# Patient Record
Sex: Female | Born: 1955 | Race: White | Hispanic: No | Marital: Married
Health system: Southern US, Community
[De-identification: ages and names within clinical notes are randomized; demographics above are authoritative.]

## PROBLEM LIST (undated history)

## (undated) DIAGNOSIS — A419 Sepsis, unspecified organism: Secondary | ICD-10-CM

## (undated) DIAGNOSIS — J942 Hemothorax: Secondary | ICD-10-CM

## (undated) DIAGNOSIS — I469 Cardiac arrest, cause unspecified: Secondary | ICD-10-CM

## (undated) DIAGNOSIS — Z930 Tracheostomy status: Secondary | ICD-10-CM

## (undated) DIAGNOSIS — J9621 Acute and chronic respiratory failure with hypoxia: Secondary | ICD-10-CM

## (undated) DIAGNOSIS — R6520 Severe sepsis without septic shock: Secondary | ICD-10-CM

## (undated) DIAGNOSIS — J181 Lobar pneumonia, unspecified organism: Secondary | ICD-10-CM

---

## 2018-03-01 ENCOUNTER — Inpatient Hospital Stay
Admission: RE | Admit: 2018-03-01 | Discharge: 2018-04-05 | Disposition: A | Discharge status: Home Health | Payer: Medicare Other | Source: Ambulatory Visit | Attending: Internal Medicine | Admitting: Internal Medicine

## 2018-03-01 ENCOUNTER — Other Ambulatory Visit (HOSPITAL_COMMUNITY): Payer: Medicare Other

## 2018-03-01 DIAGNOSIS — J9690 Respiratory failure, unspecified, unspecified whether with hypoxia or hypercapnia: Secondary | ICD-10-CM

## 2018-03-01 DIAGNOSIS — J189 Pneumonia, unspecified organism: Secondary | ICD-10-CM

## 2018-03-01 DIAGNOSIS — J9621 Acute and chronic respiratory failure with hypoxia: Secondary | ICD-10-CM | POA: Diagnosis present

## 2018-03-01 DIAGNOSIS — R112 Nausea with vomiting, unspecified: Secondary | ICD-10-CM

## 2018-03-01 DIAGNOSIS — S92919A Unspecified fracture of unspecified toe(s), initial encounter for closed fracture: Secondary | ICD-10-CM

## 2018-03-01 DIAGNOSIS — I469 Cardiac arrest, cause unspecified: Secondary | ICD-10-CM | POA: Diagnosis present

## 2018-03-01 DIAGNOSIS — J942 Hemothorax: Secondary | ICD-10-CM | POA: Diagnosis present

## 2018-03-01 DIAGNOSIS — A419 Sepsis, unspecified organism: Secondary | ICD-10-CM | POA: Diagnosis present

## 2018-03-01 DIAGNOSIS — J939 Pneumothorax, unspecified: Secondary | ICD-10-CM

## 2018-03-01 DIAGNOSIS — Z431 Encounter for attention to gastrostomy: Secondary | ICD-10-CM

## 2018-03-01 DIAGNOSIS — L0291 Cutaneous abscess, unspecified: Secondary | ICD-10-CM

## 2018-03-01 DIAGNOSIS — Z4659 Encounter for fitting and adjustment of other gastrointestinal appliance and device: Secondary | ICD-10-CM

## 2018-03-01 DIAGNOSIS — R52 Pain, unspecified: Secondary | ICD-10-CM

## 2018-03-01 DIAGNOSIS — R6520 Severe sepsis without septic shock: Secondary | ICD-10-CM

## 2018-03-01 DIAGNOSIS — J181 Lobar pneumonia, unspecified organism: Secondary | ICD-10-CM | POA: Diagnosis present

## 2018-03-01 DIAGNOSIS — Z930 Tracheostomy status: Secondary | ICD-10-CM

## 2018-03-01 HISTORY — DX: Hemothorax: J94.2

## 2018-03-01 HISTORY — DX: Acute and chronic respiratory failure with hypoxia: J96.21

## 2018-03-01 HISTORY — DX: Cardiac arrest, cause unspecified: I46.9

## 2018-03-01 HISTORY — DX: Sepsis, unspecified organism: R65.20

## 2018-03-01 HISTORY — DX: Tracheostomy status: Z93.0

## 2018-03-01 HISTORY — DX: Lobar pneumonia, unspecified organism: J18.1

## 2018-03-01 HISTORY — DX: Sepsis, unspecified organism: A41.9

## 2018-03-01 LAB — BLOOD GAS, ARTERIAL
Acid-Base Excess: 6.8 mmol/L — ABNORMAL HIGH (ref 0.0–2.0)
Bicarbonate: 31.7 mmol/L — ABNORMAL HIGH (ref 20.0–28.0)
FIO2: 28
O2 Saturation: 96.5 %
PATIENT TEMPERATURE: 98.6
PEEP/CPAP: 5 cmH2O
PO2 ART: 82.2 mmHg — AB (ref 83.0–108.0)
Pressure control: 20 cmH2O
RATE: 16 resp/min
pCO2 arterial: 53 mmHg — ABNORMAL HIGH (ref 32.0–48.0)
pH, Arterial: 7.393 (ref 7.350–7.450)

## 2018-03-01 MED ORDER — GENERIC EXTERNAL MEDICATION
700.00 | Status: DC
Start: 2018-03-01 — End: 2018-03-01

## 2018-03-01 MED ORDER — IPRATROPIUM-ALBUTEROL 0.5-2.5 (3) MG/3ML IN SOLN
3.00 | RESPIRATORY_TRACT | Status: DC
Start: 2018-03-01 — End: 2018-03-01

## 2018-03-01 MED ORDER — PHENOL 1.4 % MT LIQD
2.00 | OROMUCOSAL | Status: DC
Start: ? — End: 2018-03-01

## 2018-03-01 MED ORDER — POTASSIUM CHLORIDE 20 MEQ/50ML IV SOLN
20.00 | INTRAVENOUS | Status: DC
Start: ? — End: 2018-03-01

## 2018-03-01 MED ORDER — SODIUM CHLORIDE 0.9 % IV SOLN
10.00 | INTRAVENOUS | Status: DC
Start: ? — End: 2018-03-01

## 2018-03-01 MED ORDER — NYSTATIN 100000 UNIT/ML MT SUSP
500000.00 | OROMUCOSAL | Status: DC
Start: 2018-03-01 — End: 2018-03-01

## 2018-03-01 MED ORDER — MORPHINE SULFATE (PF) 2 MG/ML IV SOLN
2.00 | INTRAVENOUS | Status: DC
Start: ? — End: 2018-03-01

## 2018-03-01 MED ORDER — POTASSIUM CHLORIDE CRYS ER 20 MEQ PO TBCR
20.00 | EXTENDED_RELEASE_TABLET | ORAL | Status: DC
Start: ? — End: 2018-03-01

## 2018-03-01 MED ORDER — PAROXETINE HCL 20 MG PO TABS
20.00 | ORAL_TABLET | ORAL | Status: DC
Start: 2018-03-02 — End: 2018-03-01

## 2018-03-01 MED ORDER — CLONAZEPAM 0.5 MG PO TABS
0.25 | ORAL_TABLET | ORAL | Status: DC
Start: ? — End: 2018-03-01

## 2018-03-01 MED ORDER — CIPROFLOXACIN IN D5W 400 MG/200ML IV SOLN
400.00 | INTRAVENOUS | Status: DC
Start: 2018-03-01 — End: 2018-03-01

## 2018-03-01 MED ORDER — CLOTRIMAZOLE-BETAMETHASONE 1-0.05 % EX CREA
TOPICAL_CREAM | CUTANEOUS | Status: DC
Start: 2018-03-01 — End: 2018-03-01

## 2018-03-01 MED ORDER — FAMOTIDINE 20 MG PO TABS
20.00 | ORAL_TABLET | ORAL | Status: DC
Start: 2018-03-01 — End: 2018-03-01

## 2018-03-01 MED ORDER — MIDODRINE HCL 5 MG PO TABS
10.00 | ORAL_TABLET | ORAL | Status: DC
Start: 2018-03-01 — End: 2018-03-01

## 2018-03-01 MED ORDER — GENERIC EXTERNAL MEDICATION
5.00 | Status: DC
Start: ? — End: 2018-03-01

## 2018-03-01 MED ORDER — POTASSIUM CHLORIDE 20 MEQ/15ML (10%) PO SOLN
20.00 | ORAL | Status: DC
Start: ? — End: 2018-03-01

## 2018-03-02 ENCOUNTER — Encounter: Payer: Self-pay | Admitting: Internal Medicine

## 2018-03-02 DIAGNOSIS — J181 Lobar pneumonia, unspecified organism: Secondary | ICD-10-CM | POA: Diagnosis present

## 2018-03-02 DIAGNOSIS — I469 Cardiac arrest, cause unspecified: Secondary | ICD-10-CM | POA: Diagnosis not present

## 2018-03-02 DIAGNOSIS — J9621 Acute and chronic respiratory failure with hypoxia: Secondary | ICD-10-CM

## 2018-03-02 DIAGNOSIS — Z930 Tracheostomy status: Secondary | ICD-10-CM

## 2018-03-02 DIAGNOSIS — R6520 Severe sepsis without septic shock: Secondary | ICD-10-CM | POA: Diagnosis present

## 2018-03-02 DIAGNOSIS — J942 Hemothorax: Secondary | ICD-10-CM | POA: Diagnosis not present

## 2018-03-02 DIAGNOSIS — A419 Sepsis, unspecified organism: Secondary | ICD-10-CM | POA: Diagnosis present

## 2018-03-02 LAB — PROTIME-INR
INR: 1.05
Prothrombin Time: 13.6 seconds (ref 11.4–15.2)
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
## 2018-03-03 DIAGNOSIS — J942 Hemothorax: Secondary | ICD-10-CM | POA: Diagnosis not present

## 2018-03-03 DIAGNOSIS — J9621 Acute and chronic respiratory failure with hypoxia: Secondary | ICD-10-CM | POA: Diagnosis not present

## 2018-03-03 DIAGNOSIS — J181 Lobar pneumonia, unspecified organism: Secondary | ICD-10-CM | POA: Diagnosis not present

## 2018-03-03 DIAGNOSIS — I469 Cardiac arrest, cause unspecified: Secondary | ICD-10-CM | POA: Diagnosis not present

## 2018-03-03 LAB — CBC
HCT: 33.1 % — ABNORMAL LOW (ref 36.0–46.0)
Hemoglobin: 10.2 g/dL — ABNORMAL LOW (ref 12.0–15.0)
MCH: 30 pg (ref 26.0–34.0)
MCHC: 30.8 g/dL (ref 30.0–36.0)
MCV: 97.4 fL (ref 80.0–100.0)
Platelets: 331 10*3/uL (ref 150–400)
RBC: 3.4 MIL/uL — ABNORMAL LOW (ref 3.87–5.11)
RDW: 18.9 % — AB (ref 11.5–15.5)
WBC: 8.6 10*3/uL (ref 4.0–10.5)
nRBC: 0 % (ref 0.0–0.2)

## 2018-03-03 LAB — BASIC METABOLIC PANEL
Anion gap: 5 (ref 5–15)
BUN: 18 mg/dL (ref 6–20)
CHLORIDE: 104 mmol/L (ref 98–111)
CO2: 35 mmol/L — AB (ref 22–32)
CREATININE: 0.45 mg/dL (ref 0.44–1.00)
Calcium: 7.6 mg/dL — ABNORMAL LOW (ref 8.9–10.3)
Glucose, Bld: 129 mg/dL — ABNORMAL HIGH (ref 70–99)
Potassium: 3.4 mmol/L — ABNORMAL LOW (ref 3.5–5.1)
SODIUM: 144 mmol/L (ref 135–145)

## 2018-03-03 LAB — PROTIME-INR
INR: 1.12
Prothrombin Time: 14.3 seconds (ref 11.4–15.2)

## 2018-03-03 NOTE — Progress Notes (Signed)
Pulmonary Critical Care Medicine [facility] [place]   PULMONARY CRITICAL CARE SERVICE  PROGRESS NOTE  Date of Service: 03/03/2018  Melissa Yates  WUJ:[id number]  DOB: Jun 16, 1955   DOA: 03/01/2018  Referring Physician: Carron Curie, MD  HPI: Melissa Yates is a 62 y.o. female seen for follow up of Acute on Chronic Respiratory Failure.  She is on the ventilator.  Apparently did prep 4 hours of pressure support earlier today according to the respiratory therapist.  Medications: Reviewed on Rounds  Physical Exam:  Vitals: Temperature 97 pulse 88 respiratory rate 24 blood pressure 100/50 saturations 97%  Ventilator Settings mode of ventilation assist control FiO2 28% tidal volume 450 PEEP 5  . General: Comfortable at this time . Eyes: Grossly normal lids, irises & conjunctiva . ENT: grossly tongue is normal . Neck: no obvious mass . Cardiovascular: S1 S2 normal no gallop . Respiratory: Coarse breath sounds without rhonchi . Abdomen: soft . Skin: no rash seen on limited exam . Musculoskeletal: not rigid . Psychiatric:unable to assess . Neurologic: no seizure no involuntary movements         Lab Data:   Basic Metabolic Panel: Recent Labs  Lab 03/03/18 0609  NA 144  K 3.4*  CL 104  CO2 35*  GLUCOSE 129*  BUN 18  CREATININE 0.45  CALCIUM 7.6*    ABG: Recent Labs  Lab 03/01/18 1539  PHART 7.393  PCO2ART 53.0*  PO2ART 82.2*  HCO3 31.7*  O2SAT 96.5    Liver Function Tests: No results for input(s): AST, ALT, ALKPHOS, BILITOT, PROT, ALBUMIN in the last 168 hours. No results for input(s): LIPASE, AMYLASE in the last 168 hours. No results for input(s): AMMONIA in the last 168 hours.  CBC: Recent Labs  Lab 03/03/18 0609  WBC 8.6  HGB 10.2*  HCT 33.1*  MCV 97.4  PLT 331    Cardiac Enzymes: No results for input(s): CKTOTAL, CKMB, CKMBINDEX, TROPONINI in the last 168 hours.  BNP (last 3 results) No results for input(s): BNP in the last  8760 hours.  ProBNP (last 3 results) No results for input(s): PROBNP in the last 8760 hours.  Radiological Exams: Dg Chest Port 1 View  Result Date: 03/01/2018 CLINICAL DATA:  Respiratory failure.  In T2 placement EXAM: PORTABLE CHEST 1 VIEW COMPARISON:  None FINDINGS: Tracheostomy tube in satisfactory position. Tubular structure in the region of the left IJ with the distal portion projecting over the cavoatrial junction. Left-sided Port-A-Cath with the tip projecting over the SVC. Nasogastric tube with the tip projecting over the stomach. No acute osseous abnormality. Small left pleural effusion. Small layering right pleural effusion. Interstitial thickening and alveolar airspace opacities throughout the right lung. Mild interstitial thickening of the left lung. No pneumothorax. Enlarged heart size. IMPRESSION: 1. Support lines and tubing in satisfactory position. 2. Cardiomegaly with bilateral pleural effusions and interstitial and alveolar airspace opacities. Findings are concerning for asymmetric pulmonary edema. Electronically Signed   By: Elige Ko   On: 03/01/2018 16:43   Dg Abd Portable 1v  Result Date: 03/01/2018 CLINICAL DATA:  NG tube placement EXAM: PORTABLE ABDOMEN - 1 VIEW COMPARISON:  None. FINDINGS: NG tube projects in the left upper quadrant, likely in the proximal to mid stomach. Moderate gaseous distention of large bowel. IVC filter noted in place. IMPRESSION: NG tube tip in the proximal to mid stomach. Electronically Signed   By: Charlett Nose M.D.   On: 03/01/2018 16:37    Assessment/Plan Active Problems:  Acute on chronic respiratory failure with hypoxia (HCC)   Lobar pneumonia (HCC)   Tracheostomy status (HCC)   Hemothorax   Severe sepsis (HCC)   Cardiac arrest (HCC)   1. Acute on chronic respiratory failure with hypoxia we will continue to advance weaning patient was able to do 4 hours.  Continue with supportive care 2. Lobar pneumonia treated we will continue  present management 3. Tracheostomy we will continue supportive care 4. Hemothorax follow-up x-rays as necessary there is some evidence of pulmonary edema on the last chest film. 5. Severe sepsis clinically resolved 6. Cardiac arrest rhythm is stable   I have personally seen and evaluated the patient, evaluated laboratory and imaging results, formulated the assessment and plan and placed orders. The Patient requires high complexity decision making for assessment and support.  Case was discussed on Rounds with the Respiratory Therapy Staff  Yevonne Pax, MD [facility] Pulmonary Critical Care Medicine Sleep Medicine

## 2018-03-04 LAB — POTASSIUM: POTASSIUM: 3.7 mmol/L (ref 3.5–5.1)

## 2018-03-04 LAB — PROTIME-INR
INR: 1.15
Prothrombin Time: 14.6 seconds (ref 11.4–15.2)

## 2018-03-05 DIAGNOSIS — J181 Lobar pneumonia, unspecified organism: Secondary | ICD-10-CM | POA: Diagnosis not present

## 2018-03-05 DIAGNOSIS — I469 Cardiac arrest, cause unspecified: Secondary | ICD-10-CM | POA: Diagnosis not present

## 2018-03-05 DIAGNOSIS — J9621 Acute and chronic respiratory failure with hypoxia: Secondary | ICD-10-CM | POA: Diagnosis not present

## 2018-03-05 DIAGNOSIS — J942 Hemothorax: Secondary | ICD-10-CM | POA: Diagnosis not present

## 2018-03-05 LAB — PROTIME-INR
INR: 1.22
Prothrombin Time: 15.2 seconds (ref 11.4–15.2)

## 2018-03-05 NOTE — Progress Notes (Signed)
Pulmonary Critical Care Medicine [facility] [place]   PULMONARY CRITICAL CARE SERVICE  PROGRESS NOTE  Date of Service: 03/05/2018  Melissa Yates  WUJ:[id number]  DOB: [birth year]   DOA: 03/01/2018  Referring Physician: Carron Curie, MD  HPI: Melissa Yates is a 62 y.o. female seen for follow up of Acute on Chronic Respiratory Failure.  Patient is on pressure support with a goal of 6 hours seems to be tolerating it well currently comfortable without distress  Medications: Reviewed on Rounds  Physical Exam:  Vitals: Temperature 97.5 pulse 85 respiratory 19 blood pressure 116/69 saturations 95%  Ventilator Settings mode of ventilation pressure support FiO2 21% per support 12 PEEP 5  . General: Comfortable at this time . Eyes: Grossly normal lids, irises & conjunctiva . ENT: grossly tongue is normal . Neck: no obvious mass . Cardiovascular: S1 S2 normal no gallop . Respiratory: No rhonchi or rales are noted . Abdomen: soft . Skin: no rash seen on limited exam . Musculoskeletal: not rigid . Psychiatric:unable to assess . Neurologic: no seizure no involuntary movements         Lab Data:   Basic Metabolic Panel: Recent Labs  Lab 03/03/18 0609 03/04/18 0612  NA 144  --   K 3.4* 3.7  CL 104  --   CO2 35*  --   GLUCOSE 129*  --   BUN 18  --   CREATININE 0.45  --   CALCIUM 7.6*  --     ABG: Recent Labs  Lab 03/01/18 1539  PHART 7.393  PCO2ART 53.0*  PO2ART 82.2*  HCO3 31.7*  O2SAT 96.5    Liver Function Tests: No results for input(s): AST, ALT, ALKPHOS, BILITOT, PROT, ALBUMIN in the last 168 hours. No results for input(s): LIPASE, AMYLASE in the last 168 hours. No results for input(s): AMMONIA in the last 168 hours.  CBC: Recent Labs  Lab 03/03/18 0609  WBC 8.6  HGB 10.2*  HCT 33.1*  MCV 97.4  PLT 331    Cardiac Enzymes: No results for input(s): CKTOTAL, CKMB, CKMBINDEX, TROPONINI in the last 168 hours.  BNP (last 3 results) No  results for input(s): BNP in the last 8760 hours.  ProBNP (last 3 results) No results for input(s): PROBNP in the last 8760 hours.  Radiological Exams: No results found.  Assessment/Plan Active Problems:   Acute on chronic respiratory failure with hypoxia (HCC)   Lobar pneumonia (HCC)   Tracheostomy status (HCC)   Hemothorax   Severe sepsis (HCC)   Cardiac arrest (HCC)   1. Acute on chronic respiratory failure with hypoxia patient right now is going to continue with pressure support will titrate oxygen as tolerated continue his pulmonary toilet goal is for 6 hours 2. Lobar pneumonia treated to improve 3. Tracheostomy continue present management 4. Hemothorax stable 5. Severe sepsis hemodynamically stable 6. Cardiac arrest rhythm is stable   I have personally seen and evaluated the patient, evaluated laboratory and imaging results, formulated the assessment and plan and placed orders. The Patient requires high complexity decision making for assessment and support.  Case was discussed on Rounds with the Respiratory Therapy Staff  Yevonne Pax, MD [facility] Pulmonary Critical Care Medicine Sleep Medicine

## 2018-03-06 DIAGNOSIS — J9621 Acute and chronic respiratory failure with hypoxia: Secondary | ICD-10-CM | POA: Diagnosis not present

## 2018-03-06 DIAGNOSIS — I469 Cardiac arrest, cause unspecified: Secondary | ICD-10-CM | POA: Diagnosis not present

## 2018-03-06 DIAGNOSIS — J942 Hemothorax: Secondary | ICD-10-CM | POA: Diagnosis not present

## 2018-03-06 DIAGNOSIS — J181 Lobar pneumonia, unspecified organism: Secondary | ICD-10-CM | POA: Diagnosis not present

## 2018-03-06 LAB — PROTIME-INR
INR: 1.62
Prothrombin Time: 19 seconds — ABNORMAL HIGH (ref 11.4–15.2)

## 2018-03-06 NOTE — Progress Notes (Signed)
Pulmonary Critical Care Medicine [facility] [place]   PULMONARY CRITICAL CARE SERVICE  PROGRESS NOTE  Date of Service: 03/06/2018  TORI DATTILIO  LKG:[id number]  DOB: 1955/08/25   DOA: 03/01/2018  Referring Physician: Carron Curie, MD  HPI: RHIA BLATCHFORD is a 62 y.o. female seen for follow up of Acute on Chronic Respiratory Failure.  Patient is currently on pressure support mode has been on 40% oxygen.  Goal was for about 12 hours  Medications: Reviewed on Rounds  Physical Exam:  Vitals: Temperature 97.5 pulse 84 respiratory rate 20 blood pressure 104/58 saturations 98%  Ventilator Settings currently on pressure support FiO2 40% per support 12 PEEP 5  . General: Comfortable at this time . Eyes: Grossly normal lids, irises & conjunctiva . ENT: grossly tongue is normal . Neck: no obvious mass . Cardiovascular: S1 S2 normal no gallop . Respiratory: No rhonchi or rales are noted at this time . Abdomen: soft . Skin: no rash seen on limited exam . Musculoskeletal: not rigid . Psychiatric:unable to assess . Neurologic: no seizure no involuntary movements         Lab Data:   Basic Metabolic Panel: Recent Labs  Lab 03/03/18 0609 03/04/18 0612  NA 144  --   K 3.4* 3.7  CL 104  --   CO2 35*  --   GLUCOSE 129*  --   BUN 18  --   CREATININE 0.45  --   CALCIUM 7.6*  --     ABG: Recent Labs  Lab 03/01/18 1539  PHART 7.393  PCO2ART 53.0*  PO2ART 82.2*  HCO3 31.7*  O2SAT 96.5    Liver Function Tests: No results for input(s): AST, ALT, ALKPHOS, BILITOT, PROT, ALBUMIN in the last 168 hours. No results for input(s): LIPASE, AMYLASE in the last 168 hours. No results for input(s): AMMONIA in the last 168 hours.  CBC: Recent Labs  Lab 03/03/18 0609  WBC 8.6  HGB 10.2*  HCT 33.1*  MCV 97.4  PLT 331    Cardiac Enzymes: No results for input(s): CKTOTAL, CKMB, CKMBINDEX, TROPONINI in the last 168 hours.  BNP (last 3 results) No results for  input(s): BNP in the last 8760 hours.  ProBNP (last 3 results) No results for input(s): PROBNP in the last 8760 hours.  Radiological Exams: No results found.  Assessment/Plan Active Problems:   Acute on chronic respiratory failure with hypoxia (HCC)   Lobar pneumonia (HCC)   Tracheostomy status (HCC)   Hemothorax   Severe sepsis (HCC)   Cardiac arrest (HCC)   1. Acute on chronic respiratory failure with hypoxia we will continue with pressure support on 40% FiO2.  Continue pulmonary toilet secretion management. 2. Lobar pneumonia treated we will continue to follow. 3. Tracheostomy stable 4. Hemothorax stable we will continue to monitor 5. Severe sepsis resolved 6. Cardiac arrest rhythm is stable we will continue to monitor   I have personally seen and evaluated the patient, evaluated laboratory and imaging results, formulated the assessment and plan and placed orders. The Patient requires high complexity decision making for assessment and support.  Case was discussed on Rounds with the Respiratory Therapy Staff  Yevonne Pax, MD [facility] Pulmonary Critical Care Medicine Sleep Medicine

## 2018-03-07 ENCOUNTER — Other Ambulatory Visit (HOSPITAL_COMMUNITY): Payer: Medicare Other

## 2018-03-07 DIAGNOSIS — I469 Cardiac arrest, cause unspecified: Secondary | ICD-10-CM | POA: Diagnosis not present

## 2018-03-07 DIAGNOSIS — J9621 Acute and chronic respiratory failure with hypoxia: Secondary | ICD-10-CM | POA: Diagnosis not present

## 2018-03-07 DIAGNOSIS — J181 Lobar pneumonia, unspecified organism: Secondary | ICD-10-CM | POA: Diagnosis not present

## 2018-03-07 DIAGNOSIS — J942 Hemothorax: Secondary | ICD-10-CM | POA: Diagnosis not present

## 2018-03-07 LAB — PROTIME-INR
INR: 1.83
Prothrombin Time: 20.9 seconds — ABNORMAL HIGH (ref 11.4–15.2)

## 2018-03-07 LAB — CBC
HCT: 39.4 % (ref 36.0–46.0)
HEMOGLOBIN: 11.5 g/dL — AB (ref 12.0–15.0)
MCH: 28.8 pg (ref 26.0–34.0)
MCHC: 29.2 g/dL — ABNORMAL LOW (ref 30.0–36.0)
MCV: 98.5 fL (ref 80.0–100.0)
PLATELETS: 299 10*3/uL (ref 150–400)
RBC: 4 MIL/uL (ref 3.87–5.11)
RDW: 19.1 % — ABNORMAL HIGH (ref 11.5–15.5)
WBC: 8.4 10*3/uL (ref 4.0–10.5)
nRBC: 0 % (ref 0.0–0.2)
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
## 2018-03-08 ENCOUNTER — Other Ambulatory Visit (HOSPITAL_COMMUNITY): Payer: Medicare Other

## 2018-03-08 DIAGNOSIS — J9621 Acute and chronic respiratory failure with hypoxia: Secondary | ICD-10-CM | POA: Diagnosis not present

## 2018-03-08 DIAGNOSIS — J942 Hemothorax: Secondary | ICD-10-CM | POA: Diagnosis not present

## 2018-03-08 DIAGNOSIS — I469 Cardiac arrest, cause unspecified: Secondary | ICD-10-CM | POA: Diagnosis not present

## 2018-03-08 DIAGNOSIS — J181 Lobar pneumonia, unspecified organism: Secondary | ICD-10-CM | POA: Diagnosis not present

## 2018-03-08 LAB — PROTIME-INR
INR: 1.93
PROTHROMBIN TIME: 21.8 s — AB (ref 11.4–15.2)
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
## 2018-03-09 DIAGNOSIS — J9621 Acute and chronic respiratory failure with hypoxia: Secondary | ICD-10-CM | POA: Diagnosis not present

## 2018-03-09 DIAGNOSIS — J181 Lobar pneumonia, unspecified organism: Secondary | ICD-10-CM | POA: Diagnosis not present

## 2018-03-09 DIAGNOSIS — I469 Cardiac arrest, cause unspecified: Secondary | ICD-10-CM | POA: Diagnosis not present

## 2018-03-09 DIAGNOSIS — J942 Hemothorax: Secondary | ICD-10-CM | POA: Diagnosis not present

## 2018-03-09 LAB — PROTIME-INR
INR: 2.24
Prothrombin Time: 24.5 seconds — ABNORMAL HIGH (ref 11.4–15.2)

## 2018-03-09 NOTE — Progress Notes (Signed)
Pulmonary Critical Care Medicine [facility] [place]   PULMONARY CRITICAL CARE SERVICE  PROGRESS NOTE  Date of Service: 03/09/2018  Melissa Yates  WJX:[id number]  DOB: [birth year]   DOA: 03/01/2018  Referring Physician: Carron Curie, MD  HPI: Melissa Yates is a 62 y.o. female seen for follow up of Acute on Chronic Respiratory Failure.  Patient is currently on T collar has been on 28% FiO2 with goal is for 8 hours  Medications: Reviewed on Rounds  Physical Exam:  Vitals: Temperature 96.7 pulse 72 respiratory 15 blood pressure 158/63  Ventilator Settings off the ventilator right now on T collar  . General: Comfortable at this time . Eyes: Grossly normal lids, irises & conjunctiva . ENT: grossly tongue is normal . Neck: no obvious mass . Cardiovascular: S1 S2 normal no gallop . Respiratory: No rhonchi or rales are noted . Abdomen: soft . Skin: no rash seen on limited exam . Musculoskeletal: not rigid . Psychiatric:unable to assess . Neurologic: no seizure no involuntary movements         Lab Data:   Basic Metabolic Panel: Recent Labs  Lab 03/03/18 0609 03/04/18 0612  NA 144  --   K 3.4* 3.7  CL 104  --   CO2 35*  --   GLUCOSE 129*  --   BUN 18  --   CREATININE 0.45  --   CALCIUM 7.6*  --     ABG: No results for input(s): PHART, PCO2ART, PO2ART, HCO3, O2SAT in the last 168 hours.  Liver Function Tests: No results for input(s): AST, ALT, ALKPHOS, BILITOT, PROT, ALBUMIN in the last 168 hours. No results for input(s): LIPASE, AMYLASE in the last 168 hours. No results for input(s): AMMONIA in the last 168 hours.  CBC: Recent Labs  Lab 03/03/18 0609 03/07/18 0026  WBC 8.6 8.4  HGB 10.2* 11.5*  HCT 33.1* 39.4  MCV 97.4 98.5  PLT 331 299    Cardiac Enzymes: No results for input(s): CKTOTAL, CKMB, CKMBINDEX, TROPONINI in the last 168 hours.  BNP (last 3 results) No results for input(s): BNP in the last 8760 hours.  ProBNP (last 3  results) No results for input(s): PROBNP in the last 8760 hours.  Radiological Exams: Dg Abd Portable 1v  Result Date: 03/08/2018 CLINICAL DATA:  Generalized abdominal pain. EXAM: PORTABLE ABDOMEN - 1 VIEW COMPARISON:  Radiograph of March 01, 2018. FINDINGS: Stable position of nasogastric tube with tip in expected position of proximal stomach. Stable colonic distention is noted. No small bowel dilatation is noted. IMPRESSION: Distal tip of nasogastric tube is seen in expected position of proximal stomach. Electronically Signed   By: Lupita Raider, M.D.   On: 03/08/2018 10:10    Assessment/Plan Active Problems:   Acute on chronic respiratory failure with hypoxia (HCC)   Lobar pneumonia (HCC)   Tracheostomy status (HCC)   Hemothorax   Severe sepsis (HCC)   Cardiac arrest (HCC)   1. Acute on chronic respiratory failure with hypoxia we will continue weaning on T collar 8-hour goal tolerating well continue to advance.  Continue secretion management pulmonary toilet.   2. Lobar pneumonia treated we will continue to monitor. 3. Hemothorax at baseline follow-up x-ray to assess for sepsis clinically resolved we will monitor. 4. Severe sepsis clinically resolved we will continue to monitor 5. Cardiac arrest rhythm is stable we will monitor   I have personally seen and evaluated the patient, evaluated laboratory and imaging results, formulated the assessment and plan  and placed orders. The Patient requires high complexity decision making for assessment and support.  Case was discussed on Rounds with the Respiratory Therapy Staff  Allyne Gee, MD [facility] Pulmonary Critical Care Medicine Sleep Medicine

## 2018-03-10 DIAGNOSIS — J181 Lobar pneumonia, unspecified organism: Secondary | ICD-10-CM | POA: Diagnosis not present

## 2018-03-10 DIAGNOSIS — J9621 Acute and chronic respiratory failure with hypoxia: Secondary | ICD-10-CM | POA: Diagnosis not present

## 2018-03-10 DIAGNOSIS — I469 Cardiac arrest, cause unspecified: Secondary | ICD-10-CM | POA: Diagnosis not present

## 2018-03-10 DIAGNOSIS — J942 Hemothorax: Secondary | ICD-10-CM | POA: Diagnosis not present

## 2018-03-10 LAB — PROTIME-INR
INR: 2.56
Prothrombin Time: 27.1 seconds — ABNORMAL HIGH (ref 11.4–15.2)

## 2018-03-10 NOTE — Progress Notes (Signed)
Pulmonary Critical Care Medicine [facility] [place]   PULMONARY CRITICAL CARE SERVICE  PROGRESS NOTE  Date of Service: 03/10/2018  Melissa Yates  ZOX:[id number]  DOB: 07/13/1955   DOA: 03/01/2018  Referring Physician: Carron Curie, MD  HPI: Melissa Yates is a 62 y.o. female seen for follow up of Acute on Chronic Respiratory Failure.  Patient right now is on T collar on 40% oxygen good saturations are noted has been on T collar for more than 12 hours  Medications: Reviewed on Rounds  Physical Exam:  Vitals: Temperature 96.9 pulse 72 respiratory rate 12 blood pressure 102/50 saturations 98%  Ventilator Settings off the ventilator on T collar FiO2 40%  . General: Comfortable at this time . Eyes: Grossly normal lids, irises & conjunctiva . ENT: grossly tongue is normal . Neck: no obvious mass . Cardiovascular: S1 S2 normal no gallop . Respiratory: No rhonchi or rales are noted . Abdomen: soft . Skin: no rash seen on limited exam . Musculoskeletal: not rigid . Psychiatric:unable to assess . Neurologic: no seizure no involuntary movements         Lab Data:   Basic Metabolic Panel: Recent Labs  Lab 03/04/18 0612  K 3.7    ABG: No results for input(s): PHART, PCO2ART, PO2ART, HCO3, O2SAT in the last 168 hours.  Liver Function Tests: No results for input(s): AST, ALT, ALKPHOS, BILITOT, PROT, ALBUMIN in the last 168 hours. No results for input(s): LIPASE, AMYLASE in the last 168 hours. No results for input(s): AMMONIA in the last 168 hours.  CBC: Recent Labs  Lab 03/07/18 0026  WBC 8.4  HGB 11.5*  HCT 39.4  MCV 98.5  PLT 299    Cardiac Enzymes: No results for input(s): CKTOTAL, CKMB, CKMBINDEX, TROPONINI in the last 168 hours.  BNP (last 3 results) No results for input(s): BNP in the last 8760 hours.  ProBNP (last 3 results) No results for input(s): PROBNP in the last 8760 hours.  Radiological Exams: No results  found.  Assessment/Plan Active Problems:   Acute on chronic respiratory failure with hypoxia (HCC)   Lobar pneumonia (HCC)   Tracheostomy status (HCC)   Hemothorax   Severe sepsis (HCC)   Cardiac arrest (HCC)   1. Acute on chronic respiratory failure with hypoxia we will continue weaning on T piece 12-hour goal today 2. Lobar pneumonia treated clinically improving 3. Pneumothorax stable we will monitor 4. Severe sepsis hemodynamically stable 5. Cardiac arrest rhythm is stable 6. Tracheostomy working hopefully towards decannulation   I have personally seen and evaluated the patient, evaluated laboratory and imaging results, formulated the assessment and plan and placed orders. The Patient requires high complexity decision making for assessment and support.  Case was discussed on Rounds with the Respiratory Therapy Staff  Yevonne Pax, MD [facility] Pulmonary Critical Care Medicine Sleep Medicine

## 2018-03-11 DIAGNOSIS — J9621 Acute and chronic respiratory failure with hypoxia: Secondary | ICD-10-CM | POA: Diagnosis not present

## 2018-03-11 DIAGNOSIS — I469 Cardiac arrest, cause unspecified: Secondary | ICD-10-CM | POA: Diagnosis not present

## 2018-03-11 DIAGNOSIS — J181 Lobar pneumonia, unspecified organism: Secondary | ICD-10-CM | POA: Diagnosis not present

## 2018-03-11 DIAGNOSIS — J942 Hemothorax: Secondary | ICD-10-CM | POA: Diagnosis not present

## 2018-03-11 LAB — CBC WITH DIFFERENTIAL/PLATELET
ABS IMMATURE GRANULOCYTES: 0.03 10*3/uL (ref 0.00–0.07)
BASOS PCT: 0 %
Basophils Absolute: 0 10*3/uL (ref 0.0–0.1)
Eosinophils Absolute: 0.3 10*3/uL (ref 0.0–0.5)
Eosinophils Relative: 3 %
HCT: 37.6 % (ref 36.0–46.0)
Hemoglobin: 11.1 g/dL — ABNORMAL LOW (ref 12.0–15.0)
IMMATURE GRANULOCYTES: 0 %
LYMPHS PCT: 18 %
Lymphs Abs: 1.7 10*3/uL (ref 0.7–4.0)
MCH: 29.1 pg (ref 26.0–34.0)
MCHC: 29.5 g/dL — ABNORMAL LOW (ref 30.0–36.0)
MCV: 98.7 fL (ref 80.0–100.0)
MONOS PCT: 9 %
Monocytes Absolute: 0.9 10*3/uL (ref 0.1–1.0)
NEUTROS ABS: 6.6 10*3/uL (ref 1.7–7.7)
Neutrophils Relative %: 70 %
PLATELETS: 234 10*3/uL (ref 150–400)
RBC: 3.81 MIL/uL — AB (ref 3.87–5.11)
RDW: 18.8 % — ABNORMAL HIGH (ref 11.5–15.5)
WBC: 9.6 10*3/uL (ref 4.0–10.5)
nRBC: 0 % (ref 0.0–0.2)

## 2018-03-11 LAB — BASIC METABOLIC PANEL
Anion gap: 7 (ref 5–15)
BUN: 26 mg/dL — AB (ref 6–20)
CHLORIDE: 100 mmol/L (ref 98–111)
CO2: 40 mmol/L — ABNORMAL HIGH (ref 22–32)
Calcium: 8.3 mg/dL — ABNORMAL LOW (ref 8.9–10.3)
Creatinine, Ser: 0.57 mg/dL (ref 0.44–1.00)
GFR calc Af Amer: >60 mL/min (ref >60)
GFR calc non Af Amer: >60 mL/min (ref >60)
GLUCOSE: 123 mg/dL — AB (ref 70–99)
POTASSIUM: 3.5 mmol/L (ref 3.5–5.1)
SODIUM: 147 mmol/L — AB (ref 135–145)

## 2018-03-11 LAB — PROTIME-INR
INR: 2.8
Prothrombin Time: 29.1 seconds — ABNORMAL HIGH (ref 11.4–15.2)

## 2018-03-11 NOTE — Progress Notes (Signed)
Pulmonary Critical Care Medicine [facility] [place]   PULMONARY CRITICAL CARE SERVICE  PROGRESS NOTE  Date of Service: 03/11/2018  Melissa Yates  [id number]  DOB: [birth year]   DOA: 03/01/2018  Referring Physician: Carron Curie, MD  HPI: Melissa Yates is a 62 y.o. female seen for follow up of Acute on Chronic Respiratory Failure.  Patient currently is on assist control mode is on 28% oxygen not able to do any weaning today.  Yesterday patient had been on T collar 8 hours  Medications: Reviewed on Rounds  Physical Exam:  Vitals: Temperature 96.9 pulse 80 respiratory 21 blood pressure 117/69 saturations 96%  Ventilator Settings mode ventilation assist control FiO2 28% tidal volume 400 PEEP 5  . General: Comfortable at this time . Eyes: Grossly normal lids, irises & conjunctiva . ENT: grossly tongue is normal . Neck: no obvious mass . Cardiovascular: S1 S2 normal no gallop . Respiratory: Coarse rhonchi expansion is equal . Abdomen: soft . Skin: no rash seen on limited exam . Musculoskeletal: not rigid . Psychiatric:unable to assess . Neurologic: no seizure no involuntary movements         Lab Data:   Basic Metabolic Panel: Recent Labs  Lab 03/11/18 0958  NA 147*  K 3.5  CL 100  CO2 40*  GLUCOSE 123*  BUN 26*  CREATININE 0.57  CALCIUM 8.3*    ABG: No results for input(s): PHART, PCO2ART, PO2ART, HCO3, O2SAT in the last 168 hours.  Liver Function Tests: No results for input(s): AST, ALT, ALKPHOS, BILITOT, PROT, ALBUMIN in the last 168 hours. No results for input(s): LIPASE, AMYLASE in the last 168 hours. No results for input(s): AMMONIA in the last 168 hours.  CBC: Recent Labs  Lab 03/07/18 0026 03/11/18 0958  WBC 8.4 9.6  NEUTROABS  --  6.6  HGB 11.5* 11.1*  HCT 39.4 37.6  MCV 98.5 98.7  PLT 299 234    Cardiac Enzymes: No results for input(s): CKTOTAL, CKMB, CKMBINDEX, TROPONINI in the last 168 hours.  BNP (last 3  results) No results for input(s): BNP in the last 8760 hours.  ProBNP (last 3 results) No results for input(s): PROBNP in the last 8760 hours.  Radiological Exams: No results found.  Assessment/Plan Active Problems:   Acute on chronic respiratory failure with hypoxia (HCC)   Lobar pneumonia (HCC)   Tracheostomy status (HCC)   Hemothorax   Severe sepsis (HCC)   Cardiac arrest (HCC)   1. Acute on chronic respiratory failure with hypoxia we will continue with full support respiratory therapy will recheck in the R SBI to see if the patient is able to wean.  We will continue pulmonary toilet 2. Lobar pneumonia treated will continue to follow-up x-rays. 3. Hemothorax improved we will continue monitoring 4. Severe sepsis hemodynamically stable resolved 5. Cardiac arrest rhythm is stable   I have personally seen and evaluated the patient, evaluated laboratory and imaging results, formulated the assessment and plan and placed orders. The Patient requires high complexity decision making for assessment and support.  Case was discussed on Rounds with the Respiratory Therapy Staff  Yevonne Pax, MD [facility] Pulmonary Critical Care Medicine Sleep Medicine

## 2018-03-12 ENCOUNTER — Other Ambulatory Visit (HOSPITAL_COMMUNITY): Payer: Medicare Other

## 2018-03-12 DIAGNOSIS — J9621 Acute and chronic respiratory failure with hypoxia: Secondary | ICD-10-CM | POA: Diagnosis not present

## 2018-03-12 DIAGNOSIS — I469 Cardiac arrest, cause unspecified: Secondary | ICD-10-CM | POA: Diagnosis not present

## 2018-03-12 DIAGNOSIS — J942 Hemothorax: Secondary | ICD-10-CM | POA: Diagnosis not present

## 2018-03-12 DIAGNOSIS — J181 Lobar pneumonia, unspecified organism: Secondary | ICD-10-CM | POA: Diagnosis not present

## 2018-03-12 LAB — PROTIME-INR
INR: 2.55
Prothrombin Time: 27.1 seconds — ABNORMAL HIGH (ref 11.4–15.2)

## 2018-03-12 MED ORDER — IOHEXOL 300 MG/ML  SOLN
100.0000 mL | Freq: Once | INTRAMUSCULAR | Status: AC | PRN
  Administered 2018-03-12: 100 mL via INTRAVENOUS

## 2018-03-12 NOTE — Progress Notes (Signed)
Pulmonary Critical Care Medicine [facility] [place]   PULMONARY CRITICAL CARE SERVICE  PROGRESS NOTE  Date of Service: 03/12/2018  Melissa Yates  [id number]  DOB: [birth year]   DOA: 03/01/2018  Referring Physician: Carron Curie, MD  HPI: Melissa Yates is a 62 y.o. female seen for follow up of Acute on Chronic Respiratory Failure.  Currently is on pressure support mode patient has been on 28% oxygen with good saturations.  PEEP is 5 CT scan showed fluid collection which may be hindering her from being able to wean  Medications: Reviewed on Rounds  Physical Exam:  Vitals: Temperature 98.3 pulse 59 respiratory rate 16 blood pressure 107/59 saturations 98%  Ventilator Settings mode of ventilation pressure support FiO2 28% tidal volume 470 PEEP 5  . General: Comfortable at this time . Eyes: Grossly normal lids, irises & conjunctiva . ENT: grossly tongue is normal . Neck: no obvious mass . Cardiovascular: S1 S2 normal no gallop . Respiratory: Few rhonchi no rales are noted . Abdomen: soft . Skin: no rash seen on limited exam . Musculoskeletal: not rigid . Psychiatric:unable to assess . Neurologic: no seizure no involuntary movements         Lab Data:   Basic Metabolic Panel: Recent Labs  Lab 03/11/18 0958  NA 147*  K 3.5  CL 100  CO2 40*  GLUCOSE 123*  BUN 26*  CREATININE 0.57  CALCIUM 8.3*    ABG: No results for input(s): PHART, PCO2ART, PO2ART, HCO3, O2SAT in the last 168 hours.  Liver Function Tests: No results for input(s): AST, ALT, ALKPHOS, BILITOT, PROT, ALBUMIN in the last 168 hours. No results for input(s): LIPASE, AMYLASE in the last 168 hours. No results for input(s): AMMONIA in the last 168 hours.  CBC: Recent Labs  Lab 03/07/18 0026 03/11/18 0958  WBC 8.4 9.6  NEUTROABS  --  6.6  HGB 11.5* 11.1*  HCT 39.4 37.6  MCV 98.5 98.7  PLT 299 234    Cardiac Enzymes: No results for input(s): CKTOTAL, CKMB, CKMBINDEX,  TROPONINI in the last 168 hours.  BNP (last 3 results) No results for input(s): BNP in the last 8760 hours.  ProBNP (last 3 results) No results for input(s): PROBNP in the last 8760 hours.  Radiological Exams: Ct Abdomen Pelvis W Contrast  Result Date: 03/12/2018 CLINICAL DATA:  Acute onset of generalized abdominal pain. Severe sepsis. EXAM: CT ABDOMEN AND PELVIS WITH CONTRAST TECHNIQUE: Multidetector CT imaging of the abdomen and pelvis was performed using the standard protocol following bolus administration of intravenous contrast. CONTRAST:  OMNIPAQUE IOHEXOL 300 MG/ML  SOLN COMPARISON:  Abdominal radiograph performed 03/08/2018 FINDINGS: Lower chest: Mild bibasilar atelectasis is noted. The visualized portions of the mediastinum are unremarkable. The patient's enteric tube is noted ending at the fundus of the stomach. Hepatobiliary: The liver is unremarkable in appearance. The gallbladder is unremarkable in appearance. The common bile duct remains normal in caliber. Pancreas: The pancreas is within normal limits. Spleen: The spleen is unremarkable in appearance. Adrenals/Urinary Tract: The adrenal glands are unremarkable in appearance. The kidneys are within normal limits. There is no evidence of hydronephrosis. No renal or ureteral stones are identified, though evaluation is suboptimal on the left due to apparent contrast in the renal calyces. No perinephric stranding is seen. Stomach/Bowel: The stomach is unremarkable in appearance. The small bowel is within normal limits. The appendix is not visualized; there is no evidence for appendicitis. The colon is unremarkable in appearance. Vascular/Lymphatic: The abdominal  aorta is unremarkable in appearance. An IVC filter is noted in expected position, though the prongs appear to extend outside of the wall of the IVC. No retroperitoneal lymphadenopathy is seen. No pelvic sidewall lymphadenopathy is identified. Reproductive: There is vague  prominence of the soft tissues about the level of the vaginal vault, without definite mass. The uterus is grossly unremarkable. The ovaries are relatively symmetric. The bladder is decompressed, with a suprapubic catheter in place. Other: A large collection of fluid at the right breast, measuring up to 20.4 x 5.6 cm, may reflect prior surgery. Would correlate clinically. There is diffuse atrophy of much of the musculature. Diffuse soft tissue edema is noted along the proximal thighs bilaterally, with chronic degenerative change at both acetabula and a chronic displaced fracture of the proximal left femur. Musculoskeletal: No acute osseous abnormalities are identified. The visualized musculature is unremarkable in appearance. IMPRESSION: 1. No acute abnormality seen to explain the patient's symptoms. 2. Large collection of fluid at the right breast, measuring up to 20.4 x 5.6 cm, may reflect prior surgery. Would correlate clinically, to help exclude abscess. 3. Vague prominence of the soft tissues about the level of the vaginal vault, without definite mass. Would correlate for any associated symptoms. 4. Diffuse atrophy of much of the musculature. Diffuse soft tissue edema along the proximal thighs bilaterally, with chronic degenerative change at both acetabula and chronic displaced fracture of the proximal left femur. 5. Mild bibasilar atelectasis. Electronically Signed   By: Roanna Raider M.D.   On: 03/12/2018 01:43    Assessment/Plan Active Problems:   Acute on chronic respiratory failure with hypoxia (HCC)   Lobar pneumonia (HCC)   Tracheostomy status (HCC)   Hemothorax   Severe sepsis (HCC)   Cardiac arrest (HCC)   1. Acute on chronic respiratory failure with hypoxia patient was able to do about 2 hours weaning will try to continue to advance.  Continue pulmonary toilet secretion management. 2. Lobar pneumonia treated we will continue with supportive care 3. hemothorax stable at this time follow  radiologically.  She did have fluid collection around the right breast possibly a reflection of prior surgery 4. Severe sepsis hemodynamically stable doing better. 5. Cardiac arrest rhythm is been stable   I have personally seen and evaluated the patient, evaluated laboratory and imaging results, formulated the assessment and plan and placed orders. The Patient requires high complexity decision making for assessment and support.  Case was discussed on Rounds with the Respiratory Therapy Staff  Yevonne Pax, MD [facility] Pulmonary Critical Care Medicine Sleep Medicine

## 2018-03-13 DIAGNOSIS — I469 Cardiac arrest, cause unspecified: Secondary | ICD-10-CM | POA: Diagnosis not present

## 2018-03-13 DIAGNOSIS — J9621 Acute and chronic respiratory failure with hypoxia: Secondary | ICD-10-CM | POA: Diagnosis not present

## 2018-03-13 DIAGNOSIS — J942 Hemothorax: Secondary | ICD-10-CM | POA: Diagnosis not present

## 2018-03-13 DIAGNOSIS — J181 Lobar pneumonia, unspecified organism: Secondary | ICD-10-CM | POA: Diagnosis not present

## 2018-03-13 LAB — BASIC METABOLIC PANEL
Anion gap: 7 (ref 5–15)
BUN: 22 mg/dL — AB (ref 6–20)
CALCIUM: 8.2 mg/dL — AB (ref 8.9–10.3)
CO2: 38 mmol/L — ABNORMAL HIGH (ref 22–32)
CREATININE: 0.66 mg/dL (ref 0.44–1.00)
Chloride: 104 mmol/L (ref 98–111)
GFR calc non Af Amer: >60 mL/min (ref >60)
Glucose, Bld: 119 mg/dL — ABNORMAL HIGH (ref 70–99)
Potassium: 3.4 mmol/L — ABNORMAL LOW (ref 3.5–5.1)
SODIUM: 149 mmol/L — AB (ref 135–145)

## 2018-03-13 LAB — PROTIME-INR
INR: 2.45
Prothrombin Time: 26.2 seconds — ABNORMAL HIGH (ref 11.4–15.2)

## 2018-03-13 NOTE — Progress Notes (Signed)
Pulmonary Critical Care Medicine [facility] [place]   PULMONARY CRITICAL CARE SERVICE  PROGRESS NOTE  Date of Service: 03/13/2018  Melissa Yates  ZOX:[id number]  DOB: June 29, 1955   DOA: 03/01/2018  Referring Physician: Carron Curie, MD  HPI: Melissa Yates is a 62 y.o. female seen for follow up of Acute on Chronic Respiratory Failure.  She is back on her weaning currently is on T collar has been on 35% FiO2 the goal is to wean her for up to 16 hours today  Medications: Reviewed on Rounds  Physical Exam:  Vitals: Temperature 96.5 pulse 97 respiratory rate 15 blood pressure 103/58 saturation 98%  Ventilator Settings off the ventilator on T collar FiO2 35%  . General: Comfortable at this time . Eyes: Grossly normal lids, irises & conjunctiva . ENT: grossly tongue is normal . Neck: no obvious mass . Cardiovascular: S1 S2 normal no gallop . Respiratory: No rhonchi or rales are noted at this time . Abdomen: soft . Skin: no rash seen on limited exam . Musculoskeletal: not rigid . Psychiatric:unable to assess . Neurologic: no seizure no involuntary movements         Lab Data:   Basic Metabolic Panel: Recent Labs  Lab 03/11/18 0958 03/13/18 0807  NA 147* 149*  K 3.5 3.4*  CL 100 104  CO2 40* 38*  GLUCOSE 123* 119*  BUN 26* 22*  CREATININE 0.57 0.66  CALCIUM 8.3* 8.2*    ABG: No results for input(s): PHART, PCO2ART, PO2ART, HCO3, O2SAT in the last 168 hours.  Liver Function Tests: No results for input(s): AST, ALT, ALKPHOS, BILITOT, PROT, ALBUMIN in the last 168 hours. No results for input(s): LIPASE, AMYLASE in the last 168 hours. No results for input(s): AMMONIA in the last 168 hours.  CBC: Recent Labs  Lab 03/07/18 0026 03/11/18 0958  WBC 8.4 9.6  NEUTROABS  --  6.6  HGB 11.5* 11.1*  HCT 39.4 37.6  MCV 98.5 98.7  PLT 299 234    Cardiac Enzymes: No results for input(s): CKTOTAL, CKMB, CKMBINDEX, TROPONINI in the last 168 hours.  BNP  (last 3 results) No results for input(s): BNP in the last 8760 hours.  ProBNP (last 3 results) No results for input(s): PROBNP in the last 8760 hours.  Radiological Exams: Ct Abdomen Pelvis W Contrast  Result Date: 03/12/2018 CLINICAL DATA:  Acute onset of generalized abdominal pain. Severe sepsis. EXAM: CT ABDOMEN AND PELVIS WITH CONTRAST TECHNIQUE: Multidetector CT imaging of the abdomen and pelvis was performed using the standard protocol following bolus administration of intravenous contrast. CONTRAST:  OMNIPAQUE IOHEXOL 300 MG/ML  SOLN COMPARISON:  Abdominal radiograph performed 03/08/2018 FINDINGS: Lower chest: Mild bibasilar atelectasis is noted. The visualized portions of the mediastinum are unremarkable. The patient's enteric tube is noted ending at the fundus of the stomach. Hepatobiliary: The liver is unremarkable in appearance. The gallbladder is unremarkable in appearance. The common bile duct remains normal in caliber. Pancreas: The pancreas is within normal limits. Spleen: The spleen is unremarkable in appearance. Adrenals/Urinary Tract: The adrenal glands are unremarkable in appearance. The kidneys are within normal limits. There is no evidence of hydronephrosis. No renal or ureteral stones are identified, though evaluation is suboptimal on the left due to apparent contrast in the renal calyces. No perinephric stranding is seen. Stomach/Bowel: The stomach is unremarkable in appearance. The small bowel is within normal limits. The appendix is not visualized; there is no evidence for appendicitis. The colon is unremarkable in appearance.  Vascular/Lymphatic: The abdominal aorta is unremarkable in appearance. An IVC filter is noted in expected position, though the prongs appear to extend outside of the wall of the IVC. No retroperitoneal lymphadenopathy is seen. No pelvic sidewall lymphadenopathy is identified. Reproductive: There is vague prominence of the soft tissues about the level of  the vaginal vault, without definite mass. The uterus is grossly unremarkable. The ovaries are relatively symmetric. The bladder is decompressed, with a suprapubic catheter in place. Other: A large collection of fluid at the right breast, measuring up to 20.4 x 5.6 cm, may reflect prior surgery. Would correlate clinically. There is diffuse atrophy of much of the musculature. Diffuse soft tissue edema is noted along the proximal thighs bilaterally, with chronic degenerative change at both acetabula and a chronic displaced fracture of the proximal left femur. Musculoskeletal: No acute osseous abnormalities are identified. The visualized musculature is unremarkable in appearance. IMPRESSION: 1. No acute abnormality seen to explain the patient's symptoms. 2. Large collection of fluid at the right breast, measuring up to 20.4 x 5.6 cm, may reflect prior surgery. Would correlate clinically, to help exclude abscess. 3. Vague prominence of the soft tissues about the level of the vaginal vault, without definite mass. Would correlate for any associated symptoms. 4. Diffuse atrophy of much of the musculature. Diffuse soft tissue edema along the proximal thighs bilaterally, with chronic degenerative change at both acetabula and chronic displaced fracture of the proximal left femur. 5. Mild bibasilar atelectasis. Electronically Signed   By: Roanna Raider M.D.   On: 03/12/2018 01:43    Assessment/Plan Active Problems:   Acute on chronic respiratory failure with hypoxia (HCC)   Lobar pneumonia (HCC)   Tracheostomy status (HCC)   Hemothorax   Severe sepsis (HCC)   Cardiac arrest (HCC)   1. Acute on chronic respiratory failure with hypoxia continue weaning on T collar titrate oxygen as tolerated continue pulmonary toilet. 2. Lobar pneumonia treated clinically improving 3. Tracheostomy will remain in place 4. Hemothorax continue with supportive care no hemothorax however he did have some fluid collection in the right  breast 5. Severe sepsis resolved 6. Cardiac arrest rhythm is been stable   I have personally seen and evaluated the patient, evaluated laboratory and imaging results, formulated the assessment and plan and placed orders. The Patient requires high complexity decision making for assessment and support.  Case was discussed on Rounds with the Respiratory Therapy Staff  Yevonne Pax, MD [facility] Pulmonary Critical Care Medicine Sleep Medicine

## 2018-03-13 NOTE — Progress Notes (Signed)
IR received request for placement of gastrostomy tube. Recent CT reviewed. Stomach is predominantly covered by the liver but may have an amenable approach for percutaneous placement. Chart reviewed, on chronic Coumadin. Last INR was 2.45 Coumadin will need to be held several days to allow INR to normalize. Will tentatively plan for procedure later this week.  Brayton El PA-C Interventional Radiology 03/13/2018 3:12 PM

## 2018-03-14 DIAGNOSIS — J942 Hemothorax: Secondary | ICD-10-CM | POA: Diagnosis not present

## 2018-03-14 DIAGNOSIS — I469 Cardiac arrest, cause unspecified: Secondary | ICD-10-CM | POA: Diagnosis not present

## 2018-03-14 DIAGNOSIS — J181 Lobar pneumonia, unspecified organism: Secondary | ICD-10-CM | POA: Diagnosis not present

## 2018-03-14 DIAGNOSIS — J9621 Acute and chronic respiratory failure with hypoxia: Secondary | ICD-10-CM | POA: Diagnosis not present

## 2018-03-14 LAB — PROTIME-INR
INR: 2.67
Prothrombin Time: 28.1 seconds — ABNORMAL HIGH (ref 11.4–15.2)
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
## 2018-03-14 NOTE — Progress Notes (Signed)
Pulmonary Critical Care Medicine [facility] [place]   PULMONARY CRITICAL CARE SERVICE  PROGRESS NOTE  Date of Service: 03/14/2018  Melissa MansDonna H Rash  [id number]  DOB: [birth year]   DOA: 03/01/2018  Referring Physician: Carron CurieAli Hijazi, MD  HPI: Melissa Yates is a 62 y.o. female seen for follow up of Acute on Chronic Respiratory Failure.  Patient is on T collar currently on 28% FiO2 has been off the ventilator for more than 24 hours now  Medications: Reviewed on Rounds  Physical Exam:  Vitals: Temperature 96.8 pulse 62 respiratory rate 16 blood pressure 91/57 saturations 99%  Ventilator Settings off the ventilator on T collar trials  . General: Comfortable at this time . Eyes: Grossly normal lids, irises & conjunctiva . ENT: grossly tongue is normal . Neck: no obvious mass . Cardiovascular: S1 S2 normal no gallop . Respiratory: No rhonchi or rales . Abdomen: soft . Skin: no rash seen on limited exam . Musculoskeletal: not rigid . Psychiatric:unable to assess . Neurologic: no seizure no involuntary movements         Lab Data:   Basic Metabolic Panel: Recent Labs  Lab 03/11/18 0958 03/13/18 0807  NA 147* 149*  K 3.5 3.4*  CL 100 104  CO2 40* 38*  GLUCOSE 123* 119*  BUN 26* 22*  CREATININE 0.57 0.66  CALCIUM 8.3* 8.2*    ABG: No results for input(s): PHART, PCO2ART, PO2ART, HCO3, O2SAT in the last 168 hours.  Liver Function Tests: No results for input(s): AST, ALT, ALKPHOS, BILITOT, PROT, ALBUMIN in the last 168 hours. No results for input(s): LIPASE, AMYLASE in the last 168 hours. No results for input(s): AMMONIA in the last 168 hours.  CBC: Recent Labs  Lab 03/11/18 0958  WBC 9.6  NEUTROABS 6.6  HGB 11.1*  HCT 37.6  MCV 98.7  PLT 234    Cardiac Enzymes: No results for input(s): CKTOTAL, CKMB, CKMBINDEX, TROPONINI in the last 168 hours.  BNP (last 3 results) No results for input(s): BNP in the last 8760 hours.  ProBNP (last 3  results) No results for input(s): PROBNP in the last 8760 hours.  Radiological Exams: No results found.  Assessment/Plan Active Problems:   Acute on chronic respiratory failure with hypoxia (HCC)   Lobar pneumonia (HCC)   Tracheostomy status (HCC)   Hemothorax   Severe sepsis (HCC)   Cardiac arrest (HCC)   1. Acute on chronic respiratory failure with hypoxia continue weaning on T collar hopefully should be able to begin PMV and capping. 2. Lobar pneumonia treated clinically improved 3. Pneumothorax resolved 4. Severe sepsis hemodynamically stable 5. Cardiac arrest rhythm is stable we will continue to monitor   I have personally seen and evaluated the patient, evaluated laboratory and imaging results, formulated the assessment and plan and placed orders. The Patient requires high complexity decision making for assessment and support.  Case was discussed on Rounds with the Respiratory Therapy Staff  Yevonne PaxSaadat A Khan, MD [facility] Pulmonary Critical Care Medicine Sleep Medicine

## 2018-03-15 ENCOUNTER — Other Ambulatory Visit (HOSPITAL_COMMUNITY): Payer: Medicare Other

## 2018-03-15 DIAGNOSIS — J181 Lobar pneumonia, unspecified organism: Secondary | ICD-10-CM | POA: Diagnosis not present

## 2018-03-15 DIAGNOSIS — I469 Cardiac arrest, cause unspecified: Secondary | ICD-10-CM | POA: Diagnosis not present

## 2018-03-15 DIAGNOSIS — J9621 Acute and chronic respiratory failure with hypoxia: Secondary | ICD-10-CM | POA: Diagnosis not present

## 2018-03-15 DIAGNOSIS — J942 Hemothorax: Secondary | ICD-10-CM | POA: Diagnosis not present

## 2018-03-15 LAB — PROTIME-INR
INR: 2.8
PROTHROMBIN TIME: 29.1 s — AB (ref 11.4–15.2)

## 2018-03-15 LAB — BASIC METABOLIC PANEL
Anion gap: 4 — ABNORMAL LOW (ref 5–15)
BUN: 33 mg/dL — AB (ref 6–20)
CALCIUM: 8.3 mg/dL — AB (ref 8.9–10.3)
CO2: 42 mmol/L — AB (ref 22–32)
Chloride: 105 mmol/L (ref 98–111)
Creatinine, Ser: 0.74 mg/dL (ref 0.44–1.00)
GFR calc Af Amer: >60 mL/min (ref >60)
GLUCOSE: 128 mg/dL — AB (ref 70–99)
Potassium: 3.4 mmol/L — ABNORMAL LOW (ref 3.5–5.1)
Sodium: 151 mmol/L — ABNORMAL HIGH (ref 135–145)

## 2018-03-15 LAB — TROPONIN I
Troponin I: <0.03 ng/mL (ref <0.03)
Troponin I: <0.03 ng/mL (ref <0.03)

## 2018-03-15 NOTE — Progress Notes (Signed)
[facility] [place]   PULMONARY CRITICAL CARE SERVICE  PROGRESS NOTE  Date of Service: 03/15/2018  Melissa Yates  [id number]  DOB: May 20, 1955   DOA: 03/01/2018  Referring Physician: Carron CurieAli Hijazi, MD  HPI: Melissa MansDonna H Shearn is a 62 y.o. female seen for follow up of Acute on Chronic Respiratory Failure.  Patient is on T collar right now currently on 28% oxygen has been off the ventilator for more than 24 hours  Medications: Reviewed on Rounds  Physical Exam:  Vitals: Temperature 96.1 pulse 68 respiratory rate 16 blood pressure 115/64 saturations 99%  Ventilator Settings currently off the ventilator on T collar  . General: Comfortable at this time . Eyes: Grossly normal lids, irises & conjunctiva . ENT: grossly tongue is normal . Neck: no obvious mass . Cardiovascular: S1 S2 normal no gallop . Respiratory: No rhonchi no rales . Abdomen: soft . Skin: no rash seen on limited exam . Musculoskeletal: not rigid . Psychiatric:unable to assess . Neurologic: no seizure no involuntary movements         Lab Data:   Basic Metabolic Panel: Recent Labs  Lab 03/11/18 0958 03/13/18 0807 03/15/18 0506  NA 147* 149* 151*  K 3.5 3.4* 3.4*  CL 100 104 105  CO2 40* 38* 42*  GLUCOSE 123* 119* 128*  BUN 26* 22* 33*  CREATININE 0.57 0.66 0.74  CALCIUM 8.3* 8.2* 8.3*    ABG: No results for input(s): PHART, PCO2ART, PO2ART, HCO3, O2SAT in the last 168 hours.  Liver Function Tests: No results for input(s): AST, ALT, ALKPHOS, BILITOT, PROT, ALBUMIN in the last 168 hours. No results for input(s): LIPASE, AMYLASE in the last 168 hours. No results for input(s): AMMONIA in the last 168 hours.  CBC: Recent Labs  Lab 03/11/18 0958  WBC 9.6  NEUTROABS 6.6  HGB 11.1*  HCT 37.6  MCV 98.7  PLT 234    Cardiac Enzymes: Recent Labs  Lab 03/15/18 1136  TROPONINI <0.03    BNP (last 3 results) No results for input(s): BNP in the  last 8760 hours.  ProBNP (last 3 results) No results for input(s): PROBNP in the last 8760 hours.  Radiological Exams: No results found.  Assessment/Plan Active Problems:   Acute on chronic respiratory failure with hypoxia (HCC)   Lobar pneumonia (HCC)   Tracheostomy status (HCC)   Hemothorax   Severe sepsis (HCC)   Cardiac arrest (HCC)   1. Acute on chronic respiratory failure with hypoxia continue with weaning on T collar continue with pulmonary toilet should be able to increase to try PMV again tomorrow 2. Lobar pneumonia treated we will continue to follow 3. Hemothorax at baseline 4. Severe sepsis resolved 5. Cardiac arrest rhythm is stable continue to monitor. 6. Tracheostomy remains in place   I have personally seen and evaluated the patient, evaluated laboratory and imaging results, formulated the assessment and plan and placed orders. The Patient requires high complexity decision making for assessment and support.  Case was discussed on Rounds with the Respiratory Therapy Staff  Yevonne PaxSaadat A Khan, MD [facility] Pulmonary Critical Care Medicine Sleep Medicine

## 2018-03-16 DIAGNOSIS — J181 Lobar pneumonia, unspecified organism: Secondary | ICD-10-CM | POA: Diagnosis not present

## 2018-03-16 DIAGNOSIS — J942 Hemothorax: Secondary | ICD-10-CM | POA: Diagnosis not present

## 2018-03-16 DIAGNOSIS — I469 Cardiac arrest, cause unspecified: Secondary | ICD-10-CM | POA: Diagnosis not present

## 2018-03-16 DIAGNOSIS — J9621 Acute and chronic respiratory failure with hypoxia: Secondary | ICD-10-CM | POA: Diagnosis not present

## 2018-03-16 LAB — TROPONIN I: Troponin I: <0.03 ng/mL (ref <0.03)

## 2018-03-16 LAB — PROTIME-INR
INR: 2.4
PROTHROMBIN TIME: 25.9 s — AB (ref 11.4–15.2)

## 2018-03-16 NOTE — Progress Notes (Signed)
[facility]   PULMONARY CRITICAL CARE SERVICE  PROGRESS NOTE  Date of Service: 03/16/2018  Melissa Yates  JWJ:[id number]  DOB: 03-08-1956   DOA: 03/01/2018  Referring Physician: Carron CurieAli Hijazi, MD  HPI: Melissa Yates is a 62 y.o. female seen for follow up of Acute on Chronic Respiratory Failure.  Patient is on T collar currently on 28% FiO2 has been off the ventilator for more than 48 hours now  Medications: Reviewed on Rounds  Physical Exam:  Vitals: Temperature 97.0 pulse 84 respiratory 18 blood pressure 109/56 saturations 96%  Ventilator Settings off the ventilator on T collar currently on 28% FiO2  . General: Comfortable at this time . Eyes: Grossly normal lids, irises & conjunctiva . ENT: grossly tongue is normal . Neck: no obvious mass . Cardiovascular: S1 S2 normal no gallop . Respiratory: No rhonchi or rales are noted at this time . Abdomen: soft . Skin: no rash seen on limited exam . Musculoskeletal: not rigid . Psychiatric:unable to assess . Neurologic: no seizure no involuntary movements         Lab Data:   Basic Metabolic Panel: Recent Labs  Lab 03/11/18 0958 03/13/18 0807 03/15/18 0506  NA 147* 149* 151*  K 3.5 3.4* 3.4*  CL 100 104 105  CO2 40* 38* 42*  GLUCOSE 123* 119* 128*  BUN 26* 22* 33*  CREATININE 0.57 0.66 0.74  CALCIUM 8.3* 8.2* 8.3*    ABG: No results for input(s): PHART, PCO2ART, PO2ART, HCO3, O2SAT in the last 168 hours.  Liver Function Tests: No results for input(s): AST, ALT, ALKPHOS, BILITOT, PROT, ALBUMIN in the last 168 hours. No results for input(s): LIPASE, AMYLASE in the last 168 hours. No results for input(s): AMMONIA in the last 168 hours.  CBC: Recent Labs  Lab 03/11/18 0958  WBC 9.6  NEUTROABS 6.6  HGB 11.1*  HCT 37.6  MCV 98.7  PLT 234    Cardiac Enzymes: Recent Labs  Lab 03/15/18 1136 03/15/18 1922 03/16/18 0232  TROPONINI <0.03 <0.03 <0.03     BNP (last 3 results) No results for input(s): BNP in the last 8760 hours.  ProBNP (last 3 results) No results for input(s): PROBNP in the last 8760 hours.  Radiological Exams: Dg Foot 2 Views Left  Result Date: 03/15/2018 CLINICAL DATA:  Toe fracture. EXAM: LEFT FOOT - 2 VIEW COMPARISON:  None. FINDINGS: Limited positioning due to patient condition. Marked osteopenia. There is dislocation of the fifth MTP joint. Fracture lucency at the proximal shaft of the second proximal phalanx with dorsal impaction seen on the lateral view. No visible callus, this fracture is likely acute or subacute-but fracture aging is limited by degree of profound osteopenia. Cavus foot. IMPRESSION: 1. Second proximal phalanx fracture at the proximal shaft with dorsal impaction. 2. Dislocated fifth MTP joint. 3. Profound osteopenia. Electronically Signed   By: Marnee SpringJonathon  Watts M.D.   On: 03/15/2018 15:01    Assessment/Plan Active Problems:   Acute on chronic respiratory failure with hypoxia (HCC)   Lobar pneumonia (HCC)   Tracheostomy status (HCC)   Hemothorax   Severe sepsis (HCC)   Cardiac arrest (HCC)   1. Acute on chronic respiratory failure with hypoxia we will continue with T collar trials continue trending toilet secretion management.  Patient is doing well continue to advance with weaning. 2. Lobar pneumonia treated we will continue with present management. 3. Tracheostomy remains in place she is at baseline now 4. Pneumothorax stable  at this time 5. Severe sepsis hemodynamically stable 6. Cardiac arrest rhythm has been stable at this time   I have personally seen and evaluated the patient, evaluated laboratory and imaging results, formulated the assessment and plan and placed orders. The Patient requires high complexity decision making for assessment and support.  Case was discussed on Rounds with the Respiratory Therapy Staff  Yevonne Pax, MD [facility] Pulmonary Critical Care Medicine Sleep  Medicine

## 2018-03-17 DIAGNOSIS — J9621 Acute and chronic respiratory failure with hypoxia: Secondary | ICD-10-CM | POA: Diagnosis not present

## 2018-03-17 DIAGNOSIS — J942 Hemothorax: Secondary | ICD-10-CM | POA: Diagnosis not present

## 2018-03-17 DIAGNOSIS — J181 Lobar pneumonia, unspecified organism: Secondary | ICD-10-CM | POA: Diagnosis not present

## 2018-03-17 DIAGNOSIS — I469 Cardiac arrest, cause unspecified: Secondary | ICD-10-CM | POA: Diagnosis not present

## 2018-03-17 LAB — PROTIME-INR
INR: 1.94
Prothrombin Time: 21.9 seconds — ABNORMAL HIGH (ref 11.4–15.2)

## 2018-03-17 NOTE — Progress Notes (Signed)
Pulmonary Critical Care Medicine [facility] [place]   PULMONARY CRITICAL CARE SERVICE  PROGRESS NOTE  Date of Service: 03/17/2018  ELIZ NIGG  WJX:[id number]  DOB: 1956-03-09   DOA: 03/01/2018  Referring Physician: Carron Curie, MD  HPI: BAUDELIA SCHROEPFER is a 62 y.o. female seen for follow up of Acute on Chronic Respiratory Failure.  Patient right now is on T collar has been off the ventilator for more than 48 hours  Medications: Reviewed on Rounds  Physical Exam:  Vitals: Temperature 97.2 pulse 62 respiratory rate 17 blood pressure 119/55 saturations 97%  Ventilator Settings mode of ventilation is spontaneous on T collar FiO2 28%  . General: Comfortable at this time . Eyes: Grossly normal lids, irises & conjunctiva . ENT: grossly tongue is normal . Neck: no obvious mass . Cardiovascular: S1 S2 normal no gallop . Respiratory: Coarse breath sounds no rhonchi are noted at this time . Abdomen: soft . Skin: no rash seen on limited exam . Musculoskeletal: not rigid . Psychiatric:unable to assess . Neurologic: no seizure no involuntary movements         Lab Data:   Basic Metabolic Panel: Recent Labs  Lab 03/11/18 0958 03/13/18 0807 03/15/18 0506  NA 147* 149* 151*  K 3.5 3.4* 3.4*  CL 100 104 105  CO2 40* 38* 42*  GLUCOSE 123* 119* 128*  BUN 26* 22* 33*  CREATININE 0.57 0.66 0.74  CALCIUM 8.3* 8.2* 8.3*    ABG: No results for input(s): PHART, PCO2ART, PO2ART, HCO3, O2SAT in the last 168 hours.  Liver Function Tests: No results for input(s): AST, ALT, ALKPHOS, BILITOT, PROT, ALBUMIN in the last 168 hours. No results for input(s): LIPASE, AMYLASE in the last 168 hours. No results for input(s): AMMONIA in the last 168 hours.  CBC: Recent Labs  Lab 03/11/18 0958  WBC 9.6  NEUTROABS 6.6  HGB 11.1*  HCT 37.6  MCV 98.7  PLT 234    Cardiac Enzymes: Recent Labs  Lab 03/15/18 1136 03/15/18 1922 03/16/18 0232  TROPONINI <0.03 <0.03  <0.03    BNP (last 3 results) No results for input(s): BNP in the last 8760 hours.  ProBNP (last 3 results) No results for input(s): PROBNP in the last 8760 hours.  Radiological Exams: Dg Foot 2 Views Left  Result Date: 03/15/2018 CLINICAL DATA:  Toe fracture. EXAM: LEFT FOOT - 2 VIEW COMPARISON:  None. FINDINGS: Limited positioning due to patient condition. Marked osteopenia. There is dislocation of the fifth MTP joint. Fracture lucency at the proximal shaft of the second proximal phalanx with dorsal impaction seen on the lateral view. No visible callus, this fracture is likely acute or subacute-but fracture aging is limited by degree of profound osteopenia. Cavus foot. IMPRESSION: 1. Second proximal phalanx fracture at the proximal shaft with dorsal impaction. 2. Dislocated fifth MTP joint. 3. Profound osteopenia. Electronically Signed   By: Marnee Spring M.D.   On: 03/15/2018 15:01    Assessment/Plan Active Problems:   Acute on chronic respiratory failure with hypoxia (HCC)   Lobar pneumonia (HCC)   Tracheostomy status (HCC)   Hemothorax   Severe sepsis (HCC)   Cardiac arrest (HCC)   1. Acute on chronic respiratory failure with hypoxia patient is going to be getting a PEG tube placed today remains off the ventilator we will need to change her trach out and family will be required to be retrained for tracheal care 2. Lobar pneumonia treated clinically improved 3. Tracheostomy will need to be  changed 4. Hemothorax resolved 5. Severe sepsis stable hemodynamically 6. Cardiac arrest rhythm is been stable   I have personally seen and evaluated the patient, evaluated laboratory and imaging results, formulated the assessment and plan and placed orders. The Patient requires high complexity decision making for assessment and support.  Case was discussed on Rounds with the Respiratory Therapy Staff  Yevonne PaxSaadat A Earlin Sweeden, MD [facility] Pulmonary Critical Care Medicine Sleep Medicine

## 2018-03-18 DIAGNOSIS — J181 Lobar pneumonia, unspecified organism: Secondary | ICD-10-CM | POA: Diagnosis not present

## 2018-03-18 DIAGNOSIS — J942 Hemothorax: Secondary | ICD-10-CM | POA: Diagnosis not present

## 2018-03-18 DIAGNOSIS — J9621 Acute and chronic respiratory failure with hypoxia: Secondary | ICD-10-CM | POA: Diagnosis not present

## 2018-03-18 DIAGNOSIS — I469 Cardiac arrest, cause unspecified: Secondary | ICD-10-CM | POA: Diagnosis not present

## 2018-03-18 NOTE — Progress Notes (Signed)
[facility] [place]   PULMONARY CRITICAL CARE SERVICE  PROGRESS NOTE  Date of Service: 03/18/2018  Melissa MansDonna H Dittus  WUJ:[id number]  DOB: July 08, 1955   DOA: 03/01/2018  Referring Physician: Carron CurieAli Hijazi, MD  HPI: Melissa Yates is a 62 y.o. female seen for follow up of Acute on Chronic Respiratory Failure.  Patient is on T collar currently on 28% oxygen has been off the ventilator for more than 72 hours  Medications: Reviewed on Rounds  Physical Exam:  Vitals: Temperature 98.6 pulse 82 respiratory rate 16 blood pressure 100/56 saturations 96%  Ventilator Settings off the ventilator on T collar right now  . General: Comfortable at this time . Eyes: Grossly normal lids, irises & conjunctiva . ENT: grossly tongue is normal . Neck: no obvious mass . Cardiovascular: S1 S2 normal no gallop . Respiratory: No rhonchi no rales are noted at this time . Abdomen: soft . Skin: no rash seen on limited exam . Musculoskeletal: not rigid . Psychiatric:unable to assess . Neurologic: no seizure no involuntary movements         Lab Data:   Basic Metabolic Panel: Recent Labs  Lab 03/13/18 0807 03/15/18 0506  NA 149* 151*  K 3.4* 3.4*  CL 104 105  CO2 38* 42*  GLUCOSE 119* 128*  BUN 22* 33*  CREATININE 0.66 0.74  CALCIUM 8.2* 8.3*    ABG: No results for input(s): PHART, PCO2ART, PO2ART, HCO3, O2SAT in the last 168 hours.  Liver Function Tests: No results for input(s): AST, ALT, ALKPHOS, BILITOT, PROT, ALBUMIN in the last 168 hours. No results for input(s): LIPASE, AMYLASE in the last 168 hours. No results for input(s): AMMONIA in the last 168 hours.  CBC: No results for input(s): WBC, NEUTROABS, HGB, HCT, MCV, PLT in the last 168 hours.  Cardiac Enzymes: Recent Labs  Lab 03/15/18 1136 03/15/18 1922 03/16/18 0232  TROPONINI <0.03 <0.03 <0.03    BNP (last 3 results) No results for input(s): BNP in the last 8760  hours.  ProBNP (last 3 results) No results for input(s): PROBNP in the last 8760 hours.  Radiological Exams: No results found.  Assessment/Plan Active Problems:   Acute on chronic respiratory failure with hypoxia (HCC)   Lobar pneumonia (HCC)   Tracheostomy status (HCC)   Hemothorax   Severe sepsis (HCC)   Cardiac arrest (HCC)   1. Acute on chronic respiratory failure with hypoxia patient will be continued on T collar trials continue pulmonary toilet supportive care. 2. Lobar pneumonia treated we will continue to monitor. 3. Tracheostomy remains in place she will go home with a Shiley trach instead of a Jackson trach 4. Hemothorax at baseline we will continue present management 5. Severe sepsis hemodynamically stable 6. Cardiac arrest rhythm stable   I have personally seen and evaluated the patient, evaluated laboratory and imaging results, formulated the assessment and plan and placed orders. The Patient requires high complexity decision making for assessment and support.  Case was discussed on Rounds with the Respiratory Therapy Staff  Yevonne PaxSaadat A Aaima Gaddie, MD [facility] Pulmonary Critical Care Medicine Sleep Medicine

## 2018-03-19 DIAGNOSIS — J942 Hemothorax: Secondary | ICD-10-CM | POA: Diagnosis not present

## 2018-03-19 DIAGNOSIS — I469 Cardiac arrest, cause unspecified: Secondary | ICD-10-CM | POA: Diagnosis not present

## 2018-03-19 DIAGNOSIS — J181 Lobar pneumonia, unspecified organism: Secondary | ICD-10-CM | POA: Diagnosis not present

## 2018-03-19 DIAGNOSIS — J9621 Acute and chronic respiratory failure with hypoxia: Secondary | ICD-10-CM | POA: Diagnosis not present

## 2018-03-19 LAB — PROTIME-INR
INR: 1.39
Prothrombin Time: 16.9 seconds — ABNORMAL HIGH (ref 11.4–15.2)

## 2018-03-19 NOTE — Progress Notes (Signed)
[facility] [place]   PULMONARY CRITICAL CARE SERVICE  PROGRESS NOTE  Date of Service: 03/19/2018  Melissa MansDonna H Dulude  ZHY:[id number]  DOB: April 18, 1956   DOA: 03/01/2018  Referring Physician: Carron CurieAli Hijazi, MD  HPI: Melissa Yates is a 62 y.o. female seen for follow up of Acute on Chronic Respiratory Failure.  Patient is currently on T collar weaning doing fine on 28% FiO2  Medications: Reviewed on Rounds  Physical Exam:  Vitals: Temperature 96.7 pulse 84 respiratory 15 blood pressure 99/54 saturations 96%  Ventilator Settings off the ventilator on T collar  . General: Comfortable at this time . Eyes: Grossly normal lids, irises & conjunctiva . ENT: grossly tongue is normal . Neck: no obvious mass . Cardiovascular: S1 S2 normal no gallop . Respiratory: No rhonchi no rales . Abdomen: soft . Skin: no rash seen on limited exam . Musculoskeletal: not rigid . Psychiatric:unable to assess . Neurologic: no seizure no involuntary movements         Lab Data:   Basic Metabolic Panel: Recent Labs  Lab 03/13/18 0807 03/15/18 0506  NA 149* 151*  K 3.4* 3.4*  CL 104 105  CO2 38* 42*  GLUCOSE 119* 128*  BUN 22* 33*  CREATININE 0.66 0.74  CALCIUM 8.2* 8.3*    ABG: No results for input(s): PHART, PCO2ART, PO2ART, HCO3, O2SAT in the last 168 hours.  Liver Function Tests: No results for input(s): AST, ALT, ALKPHOS, BILITOT, PROT, ALBUMIN in the last 168 hours. No results for input(s): LIPASE, AMYLASE in the last 168 hours. No results for input(s): AMMONIA in the last 168 hours.  CBC: No results for input(s): WBC, NEUTROABS, HGB, HCT, MCV, PLT in the last 168 hours.  Cardiac Enzymes: Recent Labs  Lab 03/15/18 1136 03/15/18 1922 03/16/18 0232  TROPONINI <0.03 <0.03 <0.03    BNP (last 3 results) No results for input(s): BNP in the last 8760 hours.  ProBNP (last 3 results) No results for input(s): PROBNP in the last 8760  hours.  Radiological Exams: No results found.  Assessment/Plan Active Problems:   Acute on chronic respiratory failure with hypoxia (HCC)   Lobar pneumonia (HCC)   Tracheostomy status (HCC)   Hemothorax   Severe sepsis (HCC)   Cardiac arrest (HCC)   1. Acute on chronic respiratory failure with hypoxia we will continue with T collar wean as tolerated continue secretion management pulmonary toilet. 2. Lobar pneumonia treated clinically improved 3. Hemothorax resolved 4. Severe sepsis resolved 5. Cardiac arrest rhythm is stable   I have personally seen and evaluated the patient, evaluated laboratory and imaging results, formulated the assessment and plan and placed orders. The Patient requires high complexity decision making for assessment and support.  Case was discussed on Rounds with the Respiratory Therapy Staff  Yevonne PaxSaadat A Rylen Swindler, MD [facility] Pulmonary Critical Care Medicine Sleep Medicine

## 2018-03-20 ENCOUNTER — Other Ambulatory Visit (HOSPITAL_COMMUNITY): Payer: Medicare Other

## 2018-03-20 ENCOUNTER — Encounter (HOSPITAL_COMMUNITY): Payer: Self-pay | Admitting: Interventional Radiology

## 2018-03-20 DIAGNOSIS — I469 Cardiac arrest, cause unspecified: Secondary | ICD-10-CM | POA: Diagnosis not present

## 2018-03-20 DIAGNOSIS — J9621 Acute and chronic respiratory failure with hypoxia: Secondary | ICD-10-CM | POA: Diagnosis not present

## 2018-03-20 DIAGNOSIS — J181 Lobar pneumonia, unspecified organism: Secondary | ICD-10-CM | POA: Diagnosis not present

## 2018-03-20 DIAGNOSIS — J942 Hemothorax: Secondary | ICD-10-CM | POA: Diagnosis not present

## 2018-03-20 HISTORY — PX: IR GASTROSTOMY TUBE MOD SED: IMG625

## 2018-03-20 MED ORDER — GLUCAGON HCL RDNA (DIAGNOSTIC) 1 MG IJ SOLR
INTRAMUSCULAR | Status: AC
  Filled 2018-03-20: qty 1

## 2018-03-20 MED ORDER — LIDOCAINE HCL 1 % IJ SOLN
INTRAMUSCULAR | Status: AC
  Filled 2018-03-20: qty 20

## 2018-03-20 MED ORDER — FENTANYL CITRATE (PF) 100 MCG/2ML IJ SOLN
INTRAMUSCULAR | Status: AC | PRN
  Administered 2018-03-20: 12.5 ug via INTRAVENOUS

## 2018-03-20 MED ORDER — LIDOCAINE HCL 1 % IJ SOLN
INTRAMUSCULAR | Status: AC | PRN
  Administered 2018-03-20: 5 mL

## 2018-03-20 MED ORDER — MIDAZOLAM HCL 2 MG/2ML IJ SOLN
INTRAMUSCULAR | Status: AC | PRN
  Administered 2018-03-20: 0.5 mg via INTRAVENOUS

## 2018-03-20 MED ORDER — IOPAMIDOL (ISOVUE-300) INJECTION 61%
INTRAVENOUS | Status: AC
  Administered 2018-03-20: 15 mL
  Filled 2018-03-20: qty 50

## 2018-03-20 MED ORDER — FENTANYL CITRATE (PF) 100 MCG/2ML IJ SOLN
INTRAMUSCULAR | Status: AC
  Filled 2018-03-20: qty 2

## 2018-03-20 MED ORDER — CEFAZOLIN SODIUM-DEXTROSE 2-4 GM/100ML-% IV SOLN
INTRAVENOUS | Status: AC
  Administered 2018-03-20: 2000 mg
  Filled 2018-03-20: qty 100

## 2018-03-20 MED ORDER — MIDAZOLAM HCL 2 MG/2ML IJ SOLN
INTRAMUSCULAR | Status: AC
  Filled 2018-03-20: qty 2

## 2018-03-20 MED ORDER — GLUCAGON HCL RDNA (DIAGNOSTIC) 1 MG IJ SOLR
INTRAMUSCULAR | Status: AC | PRN
  Administered 2018-03-20: .5 mg via INTRAVENOUS

## 2018-03-20 NOTE — Progress Notes (Signed)
[facility]ulmonary [facility] [place]   PULMONARY CRITICAL CARE SERVICE  PROGRESS NOTE  Date of Service: 03/20/2018  Melissa Yates  LOV:[id number]  DOB: 1955/12/17   DOA: 03/01/2018  Referring Physician: Carron CurieAli Hijazi, MD  HPI: Melissa MansDonna H Yates is a 62 y.o. female seen for follow up of Acute on Chronic Respiratory Failure.  On T collar rate now 28% FiO2  Medications: Reviewed on Rounds  Physical Exam:  Vitals: Temperature 97.2 pulse 80 respiratory rate 16 blood pressure 96/59 saturation 95%  Ventilator Settings currently off the ventilator on T collar  . General: Comfortable at this time . Eyes: Grossly normal lids, irises & conjunctiva . ENT: grossly tongue is normal . Neck: no obvious mass . Cardiovascular: S1 S2 normal no gallop . Respiratory: No rhonchi no rales . Abdomen: soft . Skin: no rash seen on limited exam . Musculoskeletal: not rigid . Psychiatric:unable to assess . Neurologic: no seizure no involuntary movements         Lab Data:   Basic Metabolic Panel: Recent Labs  Lab 03/15/18 0506  NA 151*  K 3.4*  CL 105  CO2 42*  GLUCOSE 128*  BUN 33*  CREATININE 0.74  CALCIUM 8.3*    ABG: No results for input(s): PHART, PCO2ART, PO2ART, HCO3, O2SAT in the last 168 hours.  Liver Function Tests: No results for input(s): AST, ALT, ALKPHOS, BILITOT, PROT, ALBUMIN in the last 168 hours. No results for input(s): LIPASE, AMYLASE in the last 168 hours. No results for input(s): AMMONIA in the last 168 hours.  CBC: No results for input(s): WBC, NEUTROABS, HGB, HCT, MCV, PLT in the last 168 hours.  Cardiac Enzymes: Recent Labs  Lab 03/15/18 1136 03/15/18 1922 03/16/18 0232  TROPONINI <0.03 <0.03 <0.03    BNP (last 3 results) No results for input(s): BNP in the last 8760 hours.  ProBNP (last 3 results) No results for input(s): PROBNP in the last 8760 hours.  Radiological Exams: No results  found.  Assessment/Plan Active Problems:   Acute on chronic respiratory failure with hypoxia (HCC)   Lobar pneumonia (HCC)   Tracheostomy status (HCC)   Hemothorax   Severe sepsis (HCC)   Cardiac arrest (HCC)   1. Acute on chronic respiratory failure hypoxia continue with the T collar trial continue pulmonary toilet secretion management. 2. Lobar pneumonia treated we will follow 3. Hemothorax stable 4. Severe sepsis resolved 5. Cardiac arrest rhythm stable   I have personally seen and evaluated the patient, evaluated laboratory and imaging results, formulated the assessment and plan and placed orders. The Patient requires high complexity decision making for assessment and support.  Case was discussed on Rounds with the Respiratory Therapy Staff  Yevonne PaxSaadat A Kyliyah Stirn, MD [facility] Pulmonary Critical Care Medicine Sleep Medicine

## 2018-03-20 NOTE — Procedures (Signed)
Dysphagia  S/p fluoro 7020fr Gtube  No comp Stable ebl min Full use tomorrow Report in pacs

## 2018-03-21 DIAGNOSIS — J942 Hemothorax: Secondary | ICD-10-CM | POA: Diagnosis not present

## 2018-03-21 DIAGNOSIS — J9621 Acute and chronic respiratory failure with hypoxia: Secondary | ICD-10-CM | POA: Diagnosis not present

## 2018-03-21 DIAGNOSIS — J181 Lobar pneumonia, unspecified organism: Secondary | ICD-10-CM | POA: Diagnosis not present

## 2018-03-21 DIAGNOSIS — I469 Cardiac arrest, cause unspecified: Secondary | ICD-10-CM | POA: Diagnosis not present

## 2018-03-21 NOTE — Progress Notes (Signed)
Pulmonary Critical Care Medicine [facility] [place]   PULMONARY CRITICAL CARE SERVICE  PROGRESS NOTE  Date of Service: 03/21/2018  Melissa Yates  [id number]  DOB: Feb 23, 1956   DOA: 03/01/2018  Referring Physician: Carron Curie, MD  HPI: Melissa Yates is a 62 y.o. female seen for follow up of Acute on Chronic Respiratory Failure.  Patient is on T collar she is at her baseline now she has had the PEG placed no issues with that she has the NG tube out  Medications: Reviewed on Rounds  Physical Exam:  Vitals: Temperature 98.2 pulse 74 respiratory rate 24 blood pressure 96/50 saturations 97%  Ventilator Settings currently on T collar FiO2 28%  . General: Comfortable at this time . Eyes: Grossly normal lids, irises & conjunctiva . ENT: grossly tongue is normal . Neck: no obvious mass . Cardiovascular: S1 S2 normal no gallop . Respiratory: No rhonchi or rales are noted . Abdomen: soft . Skin: no rash seen on limited exam . Musculoskeletal: not rigid . Psychiatric:unable to assess . Neurologic: no seizure no involuntary movements         Lab Data:   Basic Metabolic Panel: Recent Labs  Lab 03/15/18 0506  NA 151*  K 3.4*  CL 105  CO2 42*  GLUCOSE 128*  BUN 33*  CREATININE 0.74  CALCIUM 8.3*    ABG: No results for input(s): PHART, PCO2ART, PO2ART, HCO3, O2SAT in the last 168 hours.  Liver Function Tests: No results for input(s): AST, ALT, ALKPHOS, BILITOT, PROT, ALBUMIN in the last 168 hours. No results for input(s): LIPASE, AMYLASE in the last 168 hours. No results for input(s): AMMONIA in the last 168 hours.  CBC: No results for input(s): WBC, NEUTROABS, HGB, HCT, MCV, PLT in the last 168 hours.  Cardiac Enzymes: Recent Labs  Lab 03/15/18 1136 03/15/18 1922 03/16/18 0232  TROPONINI <0.03 <0.03 <0.03    BNP (last 3 results) No results for input(s): BNP in the last 8760 hours.  ProBNP (last 3 results) No results for input(s):  PROBNP in the last 8760 hours.  Radiological Exams: Ir Gastrostomy Tube Mod Sed  Result Date: 03/20/2018 INDICATION: Dysphagia EXAM: FLUOROSCOPIC 20 FRENCH PULL-THROUGH GASTROSTOMY Date:  03/20/2018 03/20/2018 3:34 pm Radiologist:  Judie Petit. Ruel Favors, MD Guidance:  Fluoroscopic MEDICATIONS: Ancef 2 g; Antibiotics were administered within 1 hour of the procedure. Glucagon 0.5 mg IV ANESTHESIA/SEDATION: Versed 0.5 mg mg IV; Fentanyl 12.5 mcg IV Moderate Sedation Time:  10 minutes The patient was continuously monitored during the procedure by the interventional radiology nurse under my direct supervision. CONTRAST:  15 cc Isovue-300-administered into the gastric lumen. FLUOROSCOPY TIME:  Fluoroscopy Time: 2 minutes 54 seconds (9 mGy). COMPLICATIONS: None immediate. PROCEDURE: Informed consent was obtained from the patient following explanation of the procedure, risks, benefits and alternatives. The patient understands, agrees and consents for the procedure. All questions were addressed. A time out was performed. Maximal barrier sterile technique utilized including caps, mask, sterile gowns, sterile gloves, large sterile drape, hand hygiene, and betadine prep. The left upper quadrant was sterilely prepped and draped. An oral gastric catheter was inserted into the stomach under fluoroscopy. The existing nasogastric feeding tube was removed. Air was injected into the stomach for insufflation and visualization under fluoroscopy. The air distended stomach was confirmed beneath the anterior abdominal wall in the frontal and lateral projections. Under sterile conditions and local anesthesia, a 17 gauge trocar needle was utilized to access the stomach percutaneously beneath the left subcostal margin.  Needle position was confirmed within the stomach under biplane fluoroscopy. Contrast injection confirmed position also. A single T tack was deployed for gastropexy. Over an Amplatz guide wire, a 9-French sheath was inserted  into the stomach. A snare device was utilized to capture the oral gastric catheter. The snare device was pulled retrograde from the stomach up the esophagus and out the oropharynx. The 20-French pull-through gastrostomy was connected to the snare device and pulled antegrade through the oropharynx down the esophagus into the stomach and then through the percutaneous tract external to the patient. The gastrostomy was assembled externally. Contrast injection confirms position in the stomach. Images were obtained for documentation. The patient tolerated procedure well. No immediate complication. IMPRESSION: Fluoroscopic insertion of a 20-French "pull-through" gastrostomy. Electronically Signed   By: Judie PetitM.  Shick M.D.   On: 03/20/2018 15:38    Assessment/Plan Active Problems:   Acute on chronic respiratory failure with hypoxia (HCC)   Lobar pneumonia (HCC)   Tracheostomy status (HCC)   Hemothorax   Severe sepsis (HCC)   Cardiac arrest (HCC)   1. Acute on chronic respiratory failure with hypoxia continue with T collar trials continue pulmonary toilet supportive care. 2. Lobar pneumonia treated we will continue to monitor. 3. Pneumothorax at baseline 4. Severe sepsis resolved 5. Cardiac arrest rhythm is stable 6. Tracheostomy remains in place   I have personally seen and evaluated the patient, evaluated laboratory and imaging results, formulated the assessment and plan and placed orders. The Patient requires high complexity decision making for assessment and support.  Case was discussed on Rounds with the Respiratory Therapy Staff  Yevonne PaxSaadat A Khan, MD [facility] Pulmonary Critical Care Medicine Sleep Medicine

## 2018-03-22 DIAGNOSIS — J942 Hemothorax: Secondary | ICD-10-CM | POA: Diagnosis not present

## 2018-03-22 DIAGNOSIS — J181 Lobar pneumonia, unspecified organism: Secondary | ICD-10-CM | POA: Diagnosis not present

## 2018-03-22 DIAGNOSIS — J9621 Acute and chronic respiratory failure with hypoxia: Secondary | ICD-10-CM | POA: Diagnosis not present

## 2018-03-22 DIAGNOSIS — I469 Cardiac arrest, cause unspecified: Secondary | ICD-10-CM | POA: Diagnosis not present

## 2018-03-22 LAB — CBC
HCT: 46.2 % — ABNORMAL HIGH (ref 36.0–46.0)
Hemoglobin: 13.4 g/dL (ref 12.0–15.0)
MCH: 29.1 pg (ref 26.0–34.0)
MCHC: 29 g/dL — AB (ref 30.0–36.0)
MCV: 100.4 fL — ABNORMAL HIGH (ref 80.0–100.0)
Platelets: 303 10*3/uL (ref 150–400)
RBC: 4.6 MIL/uL (ref 3.87–5.11)
RDW: 18.5 % — AB (ref 11.5–15.5)
WBC: 8.3 10*3/uL (ref 4.0–10.5)
nRBC: 0 % (ref 0.0–0.2)

## 2018-03-22 LAB — BASIC METABOLIC PANEL
Anion gap: 8 (ref 5–15)
BUN: 51 mg/dL — ABNORMAL HIGH (ref 6–20)
CALCIUM: 8.9 mg/dL (ref 8.9–10.3)
CO2: 41 mmol/L — ABNORMAL HIGH (ref 22–32)
CREATININE: 0.86 mg/dL (ref 0.44–1.00)
Chloride: 104 mmol/L (ref 98–111)
GFR calc non Af Amer: >60 mL/min (ref >60)
Glucose, Bld: 157 mg/dL — ABNORMAL HIGH (ref 70–99)
Potassium: 3.2 mmol/L — ABNORMAL LOW (ref 3.5–5.1)
SODIUM: 153 mmol/L — AB (ref 135–145)

## 2018-03-22 LAB — PROTIME-INR
INR: 1.5
Prothrombin Time: 18 seconds — ABNORMAL HIGH (ref 11.4–15.2)

## 2018-03-22 NOTE — Progress Notes (Signed)
Pulmonary Critical Care Medicine [facility] [place]   PULMONARY CRITICAL CARE SERVICE  PROGRESS NOTE  Date of Service: 03/22/2018  Melissa Yates  ZOX:[id number]  DOB: 29-Apr-1956   DOA: 03/01/2018  Referring Physician: Carron Curie, MD  HPI: Melissa Yates is a 62 y.o. female seen for follow up of Acute on Chronic Respiratory Failure.  Patient is on T collar currently on 28% oxygen saturations have been acceptable  Medications: Reviewed on Rounds  Physical Exam:  Vitals: Temperature 97.0 pulse 84 respiratory rate 16 blood pressure 98/57 saturations 96%  Ventilator Settings currently off the ventilator at baseline on T collar  . General: Comfortable at this time . Eyes: Grossly normal lids, irises & conjunctiva . ENT: grossly tongue is normal . Neck: no obvious mass . Cardiovascular: S1 S2 normal no gallop . Respiratory: No rhonchi or rales . Abdomen: soft . Skin: no rash seen on limited exam . Musculoskeletal: not rigid . Psychiatric:unable to assess . Neurologic: no seizure no involuntary movements         Lab Data:   Basic Metabolic Panel: Recent Labs  Lab 03/22/18 1159  NA 153*  K 3.2*  CL 104  CO2 41*  GLUCOSE 157*  BUN 51*  CREATININE 0.86  CALCIUM 8.9    ABG: No results for input(s): PHART, PCO2ART, PO2ART, HCO3, O2SAT in the last 168 hours.  Liver Function Tests: No results for input(s): AST, ALT, ALKPHOS, BILITOT, PROT, ALBUMIN in the last 168 hours. No results for input(s): LIPASE, AMYLASE in the last 168 hours. No results for input(s): AMMONIA in the last 168 hours.  CBC: Recent Labs  Lab 03/22/18 1029  WBC 8.3  HGB 13.4  HCT 46.2*  MCV 100.4*  PLT 303    Cardiac Enzymes: Recent Labs  Lab 03/15/18 1922 03/16/18 0232  TROPONINI <0.03 <0.03    BNP (last 3 results) No results for input(s): BNP in the last 8760 hours.  ProBNP (last 3 results) No results for input(s): PROBNP in the last 8760 hours.  Radiological  Exams: Ir Gastrostomy Tube Mod Sed  Result Date: 03/20/2018 INDICATION: Dysphagia EXAM: FLUOROSCOPIC 20 FRENCH PULL-THROUGH GASTROSTOMY Date:  03/20/2018 03/20/2018 3:34 pm Radiologist:  Judie Petit. Ruel Favors, MD Guidance:  Fluoroscopic MEDICATIONS: Ancef 2 g; Antibiotics were administered within 1 hour of the procedure. Glucagon 0.5 mg IV ANESTHESIA/SEDATION: Versed 0.5 mg mg IV; Fentanyl 12.5 mcg IV Moderate Sedation Time:  10 minutes The patient was continuously monitored during the procedure by the interventional radiology nurse under my direct supervision. CONTRAST:  15 cc Isovue-300-administered into the gastric lumen. FLUOROSCOPY TIME:  Fluoroscopy Time: 2 minutes 54 seconds (9 mGy). COMPLICATIONS: None immediate. PROCEDURE: Informed consent was obtained from the patient following explanation of the procedure, risks, benefits and alternatives. The patient understands, agrees and consents for the procedure. All questions were addressed. A time out was performed. Maximal barrier sterile technique utilized including caps, mask, sterile gowns, sterile gloves, large sterile drape, hand hygiene, and betadine prep. The left upper quadrant was sterilely prepped and draped. An oral gastric catheter was inserted into the stomach under fluoroscopy. The existing nasogastric feeding tube was removed. Air was injected into the stomach for insufflation and visualization under fluoroscopy. The air distended stomach was confirmed beneath the anterior abdominal wall in the frontal and lateral projections. Under sterile conditions and local anesthesia, a 17 gauge trocar needle was utilized to access the stomach percutaneously beneath the left subcostal margin. Needle position was confirmed within the stomach under  biplane fluoroscopy. Contrast injection confirmed position also. A single T tack was deployed for gastropexy. Over an Amplatz guide wire, a 9-French sheath was inserted into the stomach. A snare device was utilized to  capture the oral gastric catheter. The snare device was pulled retrograde from the stomach up the esophagus and out the oropharynx. The 20-French pull-through gastrostomy was connected to the snare device and pulled antegrade through the oropharynx down the esophagus into the stomach and then through the percutaneous tract external to the patient. The gastrostomy was assembled externally. Contrast injection confirms position in the stomach. Images were obtained for documentation. The patient tolerated procedure well. No immediate complication. IMPRESSION: Fluoroscopic insertion of a 20-French "pull-through" gastrostomy. Electronically Signed   By: Judie PetitM.  Shick M.D.   On: 03/20/2018 15:38    Assessment/Plan Active Problems:   Acute on chronic respiratory failure with hypoxia (HCC)   Lobar pneumonia (HCC)   Tracheostomy status (HCC)   Hemothorax   Severe sepsis (HCC)   Cardiac arrest (HCC)   1. Acute on chronic respiratory failure with hypoxia patient is comfortable on her baseline we will change the trach out to a cuffless trach 2. Lobar pneumonia treated we will continue with present therapy 3. Status of the tracheostomy is to remain in place with a #6 cuffless 4. Hemothorax stable at this time 5. Severe sepsis at baseline 6. Cardiac arrest rhythm stable   I have personally seen and evaluated the patient, evaluated laboratory and imaging results, formulated the assessment and plan and placed orders. The Patient requires high complexity decision making for assessment and support.  Case was discussed on Rounds with the Respiratory Therapy Staff  Yevonne PaxSaadat A Nidia Grogan, MD [facility] Pulmonary Critical Care Medicine Sleep Medicine

## 2018-03-23 ENCOUNTER — Other Ambulatory Visit (HOSPITAL_COMMUNITY): Payer: Medicare Other

## 2018-03-23 DIAGNOSIS — I469 Cardiac arrest, cause unspecified: Secondary | ICD-10-CM | POA: Diagnosis not present

## 2018-03-23 DIAGNOSIS — J181 Lobar pneumonia, unspecified organism: Secondary | ICD-10-CM | POA: Diagnosis not present

## 2018-03-23 DIAGNOSIS — J9621 Acute and chronic respiratory failure with hypoxia: Secondary | ICD-10-CM | POA: Diagnosis not present

## 2018-03-23 DIAGNOSIS — J942 Hemothorax: Secondary | ICD-10-CM | POA: Diagnosis not present

## 2018-03-23 LAB — URINALYSIS, ROUTINE W REFLEX MICROSCOPIC
Bilirubin Urine: NEGATIVE
GLUCOSE, UA: NEGATIVE mg/dL
Hgb urine dipstick: NEGATIVE
KETONES UR: NEGATIVE mg/dL
NITRITE: NEGATIVE
PH: 9 — AB (ref 5.0–8.0)
Protein, ur: NEGATIVE mg/dL
SPECIFIC GRAVITY, URINE: 1.01 (ref 1.005–1.030)

## 2018-03-23 LAB — PROTIME-INR
INR: 1.68
Prothrombin Time: 19.6 seconds — ABNORMAL HIGH (ref 11.4–15.2)

## 2018-03-23 LAB — SODIUM: Sodium: 149 mmol/L — ABNORMAL HIGH (ref 135–145)

## 2018-03-23 NOTE — Progress Notes (Signed)
[facility] [place]   PULMONARY CRITICAL CARE SERVICE  PROGRESS NOTE  Date of Service: 03/23/2018  Melissa Yates  JYN:[id number]  DOB: 1955-05-15   DOA: 03/01/2018  Referring Physician: Carron CurieAli Hijazi, MD  HPI: Melissa MansDonna H Yates is a 62 y.o. female seen for follow up of Acute on Chronic Respiratory Failure.  At this time patient is on T collar has been on 20% oxygen good saturations are noted.  Medications: Reviewed on Rounds  Physical Exam:  Vitals: Temperature 98.8 pulse 64 respiratory rate 10 blood pressure 90/51 saturation 98%  Ventilator Settings off the ventilator on T collar  . General: Comfortable at this time . Eyes: Grossly normal lids, irises & conjunctiva . ENT: grossly tongue is normal . Neck: no obvious mass . Cardiovascular: S1 S2 normal no gallop . Respiratory: No rhonchi coarse breath sounds . Abdomen: soft . Skin: no rash seen on limited exam . Musculoskeletal: not rigid . Psychiatric:unable to assess . Neurologic: no seizure no involuntary movements         Lab Data:   Basic Metabolic Panel: Recent Labs  Lab 03/22/18 1159 03/23/18 0608  NA 153* 149*  K 3.2*  --   CL 104  --   CO2 41*  --   GLUCOSE 157*  --   BUN 51*  --   CREATININE 0.86  --   CALCIUM 8.9  --     ABG: No results for input(s): PHART, PCO2ART, PO2ART, HCO3, O2SAT in the last 168 hours.  Liver Function Tests: No results for input(s): AST, ALT, ALKPHOS, BILITOT, PROT, ALBUMIN in the last 168 hours. No results for input(s): LIPASE, AMYLASE in the last 168 hours. No results for input(s): AMMONIA in the last 168 hours.  CBC: Recent Labs  Lab 03/22/18 1029  WBC 8.3  HGB 13.4  HCT 46.2*  MCV 100.4*  PLT 303    Cardiac Enzymes: No results for input(s): CKTOTAL, CKMB, CKMBINDEX, TROPONINI in the last 168 hours.  BNP (last 3 results) No results for input(s): BNP in the last 8760 hours.  ProBNP (last 3 results) No results  for input(s): PROBNP in the last 8760 hours.  Radiological Exams: Dg Chest Port 1 View  Result Date: 03/23/2018 CLINICAL DATA:  Pneumonia EXAM: PORTABLE CHEST 1 VIEW COMPARISON:  03/07/2018 FINDINGS: Left Port-A-Cath and tracheostomy remain in place, unchanged. Cardiomegaly. Suspect layering effusions. Perihilar and lower lobe atelectasis or infiltrates. Findings are similar prior study. IMPRESSION: Cardiomegaly with bilateral perihilar and lower lobe atelectasis or infiltrates. Findings could reflect edema. Suspect layering effusions. No real change. Electronically Signed   By: Charlett NoseKevin  Dover M.D.   On: 03/23/2018 12:01    Assessment/Plan Active Problems:   Acute on chronic respiratory failure with hypoxia (HCC)   Lobar pneumonia (HCC)   Tracheostomy status (HCC)   Hemothorax   Severe sepsis (HCC)   Cardiac arrest (HCC)   1. Acute on chronic respiratory failure with hypoxia patient is going to have her trach so downsized to a #4 so that hopefully this will help with her swallowing also hopefully will help with the PMV continue with supportive care 2. Lobar pneumonia treated 3. Tracheostomy as above we will downsize to a #4 cuffless 4. Pneumothorax clinically resolved 5. Severe sepsis resolved 6. Cardiac arrest rhythm has been stable   I have personally seen and evaluated the patient, evaluated laboratory and imaging results, formulated the assessment and plan and placed orders. The Patient requires high complexity decision making  for assessment and support.  Case was discussed on Rounds with the Respiratory Therapy Staff  Allyne Gee, MD [facility] Pulmonary Critical Care Medicine Sleep Medicine

## 2018-03-24 ENCOUNTER — Other Ambulatory Visit (HOSPITAL_COMMUNITY): Payer: Medicare Other

## 2018-03-24 DIAGNOSIS — I469 Cardiac arrest, cause unspecified: Secondary | ICD-10-CM | POA: Diagnosis not present

## 2018-03-24 DIAGNOSIS — J9621 Acute and chronic respiratory failure with hypoxia: Secondary | ICD-10-CM | POA: Diagnosis not present

## 2018-03-24 DIAGNOSIS — J942 Hemothorax: Secondary | ICD-10-CM | POA: Diagnosis not present

## 2018-03-24 DIAGNOSIS — J181 Lobar pneumonia, unspecified organism: Secondary | ICD-10-CM | POA: Diagnosis not present

## 2018-03-24 LAB — BASIC METABOLIC PANEL
Anion gap: 9 (ref 5–15)
BUN: 46 mg/dL — ABNORMAL HIGH (ref 6–20)
CALCIUM: 8.8 mg/dL — AB (ref 8.9–10.3)
CO2: 39 mmol/L — AB (ref 22–32)
CREATININE: 0.7 mg/dL (ref 0.44–1.00)
Chloride: 98 mmol/L (ref 98–111)
Glucose, Bld: 150 mg/dL — ABNORMAL HIGH (ref 70–99)
Potassium: 3 mmol/L — ABNORMAL LOW (ref 3.5–5.1)
Sodium: 146 mmol/L — ABNORMAL HIGH (ref 135–145)

## 2018-03-24 LAB — PROTIME-INR
INR: 2.04
Prothrombin Time: 22.8 seconds — ABNORMAL HIGH (ref 11.4–15.2)

## 2018-03-24 LAB — URINE CULTURE

## 2018-03-24 NOTE — Progress Notes (Signed)
[facility] [place]   PULMONARY CRITICAL CARE SERVICE  PROGRESS NOTE  Date of Service: 03/24/2018  Melissa MansDonna H Daino  UEA:[id number]  DOB: 10/07/1955   DOA: 03/01/2018  Referring Physician: Carron CurieAli Hijazi, MD  HPI: Melissa Yates is a 62 y.o. female seen for follow up of Acute on Chronic Respiratory Failure.  Patient is on T collar had a fees done which she passed for nectar thick  Medications: Reviewed on Rounds  Physical Exam:  Vitals: Temperature 97.7 pulse 59 respiratory rate 14 blood pressure 122/72 saturations 98%  Ventilator Settings off the ventilator on T collar  . General: Comfortable at this time . Eyes: Grossly normal lids, irises & conjunctiva . ENT: grossly tongue is normal . Neck: no obvious mass . Cardiovascular: S1 S2 normal no gallop . Respiratory: Coarse breath sounds no rhonchi . Abdomen: soft . Skin: no rash seen on limited exam . Musculoskeletal: not rigid . Psychiatric:unable to assess . Neurologic: no seizure no involuntary movements         Lab Data:   Basic Metabolic Panel: Recent Labs  Lab 03/22/18 1159 03/23/18 0608 03/24/18 0624  NA 153* 149* 146*  K 3.2*  --  3.0*  CL 104  --  98  CO2 41*  --  39*  GLUCOSE 157*  --  150*  BUN 51*  --  46*  CREATININE 0.86  --  0.70  CALCIUM 8.9  --  8.8*    ABG: No results for input(s): PHART, PCO2ART, PO2ART, HCO3, O2SAT in the last 168 hours.  Liver Function Tests: No results for input(s): AST, ALT, ALKPHOS, BILITOT, PROT, ALBUMIN in the last 168 hours. No results for input(s): LIPASE, AMYLASE in the last 168 hours. No results for input(s): AMMONIA in the last 168 hours.  CBC: Recent Labs  Lab 03/22/18 1029  WBC 8.3  HGB 13.4  HCT 46.2*  MCV 100.4*  PLT 303    Cardiac Enzymes: No results for input(s): CKTOTAL, CKMB, CKMBINDEX, TROPONINI in the last 168 hours.  BNP (last 3 results) No results for input(s): BNP in the last 8760  hours.  ProBNP (last 3 results) No results for input(s): PROBNP in the last 8760 hours.  Radiological Exams: Dg Chest Port 1 View  Result Date: 03/23/2018 CLINICAL DATA:  Pneumonia EXAM: PORTABLE CHEST 1 VIEW COMPARISON:  03/07/2018 FINDINGS: Left Port-A-Cath and tracheostomy remain in place, unchanged. Cardiomegaly. Suspect layering effusions. Perihilar and lower lobe atelectasis or infiltrates. Findings are similar prior study. IMPRESSION: Cardiomegaly with bilateral perihilar and lower lobe atelectasis or infiltrates. Findings could reflect edema. Suspect layering effusions. No real change. Electronically Signed   By: Charlett NoseKevin  Dover M.D.   On: 03/23/2018 12:01    Assessment/Plan Active Problems:   Acute on chronic respiratory failure with hypoxia (HCC)   Lobar pneumonia (HCC)   Tracheostomy status (HCC)   Hemothorax   Severe sepsis (HCC)   Cardiac arrest (HCC)   1. Acute on chronic respiratory failure with hypoxia we will continue weaning on T collar continue pulmonary toilet she is actually got the PMV in place and she is tolerating it better 2. Lobar pneumonia treated we will continue to monitor 3. Tracheostomy continue supportive care 4. Hemothorax resolved 5. Sepsis resolved 6. Cardiac arrest rhythm is stable   I have personally seen and evaluated the patient, evaluated laboratory and imaging results, formulated the assessment and plan and placed orders. The Patient requires high complexity decision making for assessment and support.  Case was discussed on Rounds with the Respiratory Therapy Staff  Allyne Gee, MD [facility] Pulmonary Critical Care Medicine Sleep Medicine

## 2018-03-25 DIAGNOSIS — J9621 Acute and chronic respiratory failure with hypoxia: Secondary | ICD-10-CM | POA: Diagnosis not present

## 2018-03-25 DIAGNOSIS — J181 Lobar pneumonia, unspecified organism: Secondary | ICD-10-CM | POA: Diagnosis not present

## 2018-03-25 DIAGNOSIS — I469 Cardiac arrest, cause unspecified: Secondary | ICD-10-CM | POA: Diagnosis not present

## 2018-03-25 DIAGNOSIS — J942 Hemothorax: Secondary | ICD-10-CM | POA: Diagnosis not present

## 2018-03-25 LAB — BASIC METABOLIC PANEL
Anion gap: 9 (ref 5–15)
BUN: 39 mg/dL — AB (ref 6–20)
CHLORIDE: 97 mmol/L — AB (ref 98–111)
CO2: 36 mmol/L — AB (ref 22–32)
CREATININE: 0.65 mg/dL (ref 0.44–1.00)
Calcium: 8.5 mg/dL — ABNORMAL LOW (ref 8.9–10.3)
GFR calc non Af Amer: >60 mL/min (ref >60)
Glucose, Bld: 142 mg/dL — ABNORMAL HIGH (ref 70–99)
POTASSIUM: 3.8 mmol/L (ref 3.5–5.1)
Sodium: 142 mmol/L (ref 135–145)

## 2018-03-25 LAB — PROTIME-INR
INR: 2.25
Prothrombin Time: 24.6 seconds — ABNORMAL HIGH (ref 11.4–15.2)

## 2018-03-25 NOTE — Progress Notes (Signed)
[facility] [place]   PULMONARY CRITICAL CARE SERVICE  PROGRESS NOTE  Date of Service: 03/25/2018  Melissa MansDonna H Aro  UXL:[id number]  DOB: [birth year]   DOA: 03/01/2018  Referring Physician: Carron CurieAli Hijazi, MD  HPI: Melissa Yates is a 62 y.o. female seen for follow up of Acute on Chronic Respiratory Failure.  She is doing well no major issues are noted.  Patient remains on T collar currently on 28% FiO2  Medications: Reviewed on Rounds  Physical Exam:  Vitals: Temperature 97.5 pulse 106 respiratory rate 21 blood pressure 108/60 saturations 96%  Ventilator Settings off the ventilator currently on T collar  . General: Comfortable at this time . Eyes: Grossly normal lids, irises & conjunctiva . ENT: grossly tongue is normal . Neck: no obvious mass . Cardiovascular: S1 S2 normal no gallop . Respiratory: No rhonchi or rales are noted . Abdomen: soft . Skin: no rash seen on limited exam . Musculoskeletal: not rigid . Psychiatric:unable to assess . Neurologic: no seizure no involuntary movements         Lab Data:   Basic Metabolic Panel: Recent Labs  Lab 03/22/18 1159 03/23/18 0608 03/24/18 0624 03/25/18 0724  NA 153* 149* 146* 142  K 3.2*  --  3.0* 3.8  CL 104  --  98 97*  CO2 41*  --  39* 36*  GLUCOSE 157*  --  150* 142*  BUN 51*  --  46* 39*  CREATININE 0.86  --  0.70 0.65  CALCIUM 8.9  --  8.8* 8.5*    ABG: No results for input(s): PHART, PCO2ART, PO2ART, HCO3, O2SAT in the last 168 hours.  Liver Function Tests: No results for input(s): AST, ALT, ALKPHOS, BILITOT, PROT, ALBUMIN in the last 168 hours. No results for input(s): LIPASE, AMYLASE in the last 168 hours. No results for input(s): AMMONIA in the last 168 hours.  CBC: Recent Labs  Lab 03/22/18 1029  WBC 8.3  HGB 13.4  HCT 46.2*  MCV 100.4*  PLT 303    Cardiac Enzymes: No results for input(s): CKTOTAL, CKMB, CKMBINDEX, TROPONINI in the last 168  hours.  BNP (last 3 results) No results for input(s): BNP in the last 8760 hours.  ProBNP (last 3 results) No results for input(s): PROBNP in the last 8760 hours.  Radiological Exams: No results found.  Assessment/Plan Active Problems:   Acute on chronic respiratory failure with hypoxia (HCC)   Lobar pneumonia (HCC)   Tracheostomy status (HCC)   Hemothorax   Severe sepsis (HCC)   Cardiac arrest (HCC)   1. Acute on chronic respiratory failure with hypoxia we will continue with T collar trials titrate oxygen as tolerated continue pulmonary toilet 2. Lobar pneumonia treated we will monitor x-rays 3. Hemothorax resolved 4. Severe sepsis resolved 5. Cardiac arrest rhythm is stable at this time 6. Tracheostomy remains in place she is at her baseline now   I have personally seen and evaluated the patient, evaluated laboratory and imaging results, formulated the assessment and plan and placed orders. The Patient requires high complexity decision making for assessment and support.  Case was discussed on Rounds with the Respiratory Therapy Staff  Yevonne PaxSaadat A Falyn Rubel, MD [facility] Pulmonary Critical Care Medicine Sleep Medicine

## 2018-03-26 DIAGNOSIS — I469 Cardiac arrest, cause unspecified: Secondary | ICD-10-CM | POA: Diagnosis not present

## 2018-03-26 DIAGNOSIS — J9621 Acute and chronic respiratory failure with hypoxia: Secondary | ICD-10-CM | POA: Diagnosis not present

## 2018-03-26 DIAGNOSIS — J181 Lobar pneumonia, unspecified organism: Secondary | ICD-10-CM | POA: Diagnosis not present

## 2018-03-26 DIAGNOSIS — J942 Hemothorax: Secondary | ICD-10-CM | POA: Diagnosis not present

## 2018-03-26 LAB — CBC
HCT: 40.5 % (ref 36.0–46.0)
Hemoglobin: 12.1 g/dL (ref 12.0–15.0)
MCH: 29.2 pg (ref 26.0–34.0)
MCHC: 29.9 g/dL — ABNORMAL LOW (ref 30.0–36.0)
MCV: 97.8 fL (ref 80.0–100.0)
PLATELETS: 266 10*3/uL (ref 150–400)
RBC: 4.14 MIL/uL (ref 3.87–5.11)
RDW: 17.8 % — AB (ref 11.5–15.5)
WBC: 8.2 10*3/uL (ref 4.0–10.5)
nRBC: 0 % (ref 0.0–0.2)

## 2018-03-26 LAB — PROTIME-INR
INR: 2
Prothrombin Time: 22.4 seconds — ABNORMAL HIGH (ref 11.4–15.2)

## 2018-03-26 NOTE — Progress Notes (Signed)
[facility] [place]   PULMONARY CRITICAL CARE SERVICE  PROGRESS NOTE  Date of Service: 03/26/2018  Melissa Yates  RUE:[id number]  DOB: 07-24-1955   DOA: 03/01/2018  Referring Physician: Carron CurieAli Hijazi, MD  HPI: Melissa MansDonna H Yates is a 62 y.o. female seen for follow up of Acute on Chronic Respiratory Failure.  Patient is currently doing well.  She remains on trach collar currently at 28% FiO2.  Medications: Reviewed on Rounds  Physical Exam:  Vitals: Temperature 97.7 pulse 92 respirations 25 blood pressure 109/69 O2 saturation 97%.  Ventilator Settings patient is not currently on ventilator remains on trach collar.  . General: Comfortable at this time . Eyes: Grossly normal lids, irises & conjunctiva . ENT: grossly tongue is normal . Neck: no obvious mass . Cardiovascular: S1 S2 normal no gallop . Respiratory: Coarse breath sounds noted throughout. . Abdomen: soft . Skin: no rash seen on limited exam . Musculoskeletal: not rigid . Psychiatric:unable to assess . Neurologic: no seizure no involuntary movements         Lab Data:   Basic Metabolic Panel: Recent Labs  Lab 03/22/18 1159 03/23/18 0608 03/24/18 0624 03/25/18 0724  NA 153* 149* 146* 142  K 3.2*  --  3.0* 3.8  CL 104  --  98 97*  CO2 41*  --  39* 36*  GLUCOSE 157*  --  150* 142*  BUN 51*  --  46* 39*  CREATININE 0.86  --  0.70 0.65  CALCIUM 8.9  --  8.8* 8.5*    ABG: No results for input(s): PHART, PCO2ART, PO2ART, HCO3, O2SAT in the last 168 hours.  Liver Function Tests: No results for input(s): AST, ALT, ALKPHOS, BILITOT, PROT, ALBUMIN in the last 168 hours. No results for input(s): LIPASE, AMYLASE in the last 168 hours. No results for input(s): AMMONIA in the last 168 hours.  CBC: Recent Labs  Lab 03/22/18 1029 03/26/18 0506  WBC 8.3 8.2  HGB 13.4 12.1  HCT 46.2* 40.5  MCV 100.4* 97.8  PLT 303 266    Cardiac Enzymes: No results for  input(s): CKTOTAL, CKMB, CKMBINDEX, TROPONINI in the last 168 hours.  BNP (last 3 results) No results for input(s): BNP in the last 8760 hours.  ProBNP (last 3 results) No results for input(s): PROBNP in the last 8760 hours.  Radiological Exams: No results found.  Assessment/Plan Active Problems:   Acute on chronic respiratory failure with hypoxia (HCC)   Lobar pneumonia (HCC)   Tracheostomy status (HCC)   Hemothorax   Severe sepsis (HCC)   Cardiac arrest (HCC)   1. Acute on chronic respiratory failure with hypoxia continue trach collar trials, titrate oxygen as tolerated and continue pulmonary toilet. 2. Lobar pneumonia treated follow-up x-ray as needed. 3. Hemothorax resolved. 4. Severe sepsis resolved. 5. Cardiac arrest rhythm is stable at this time. 6. Tracheostomy remains in place patient is at current baseline   I have personally seen and evaluated the patient, evaluated laboratory and imaging results, formulated the assessment and plan and placed orders. The Patient requires high complexity decision making for assessment and support.  Case was discussed on Rounds with the Respiratory Therapy Staff  Yevonne PaxSaadat A , MD [facility] Pulmonary Critical Care Medicine Sleep Medicine

## 2018-03-27 DIAGNOSIS — J181 Lobar pneumonia, unspecified organism: Secondary | ICD-10-CM | POA: Diagnosis not present

## 2018-03-27 DIAGNOSIS — J9621 Acute and chronic respiratory failure with hypoxia: Secondary | ICD-10-CM | POA: Diagnosis not present

## 2018-03-27 DIAGNOSIS — J942 Hemothorax: Secondary | ICD-10-CM | POA: Diagnosis not present

## 2018-03-27 DIAGNOSIS — I469 Cardiac arrest, cause unspecified: Secondary | ICD-10-CM | POA: Diagnosis not present

## 2018-03-27 LAB — PROTIME-INR
INR: 2.21
Prothrombin Time: 24.3 seconds — ABNORMAL HIGH (ref 11.4–15.2)

## 2018-03-27 NOTE — Progress Notes (Signed)
[facility] [place]   PULMONARY CRITICAL CARE SERVICE  PROGRESS NOTE  Date of Service: 03/27/2018  Melissa MansDonna H Limbach  WUJ:[id number]  DOB: 1956-01-02   DOA: 03/01/2018  Referring Physician: Carron CurieAli Hijazi, MD  HPI: Melissa Yates is a 62 y.o. female seen for follow up of Acute on Chronic Respiratory Failure.  Patient is on T collar has been on 28% oxygen and tolerating the PMV she is at her baseline  Medications: Reviewed on Rounds  Physical Exam:  Vitals: Temperature 97.7 pulse 58 respiratory rate 16 blood pressure 126/70 saturations 97%  Ventilator Settings off the ventilator right now on T collar  . General: Comfortable at this time . Eyes: Grossly normal lids, irises & conjunctiva . ENT: grossly tongue is normal . Neck: no obvious mass . Cardiovascular: S1 S2 normal no gallop . Respiratory: No rhonchi or rales are noted . Abdomen: soft . Skin: no rash seen on limited exam . Musculoskeletal: not rigid . Psychiatric:unable to assess . Neurologic: no seizure no involuntary movements         Lab Data:   Basic Metabolic Panel: Recent Labs  Lab 03/22/18 1159 03/23/18 0608 03/24/18 0624 03/25/18 0724  NA 153* 149* 146* 142  K 3.2*  --  3.0* 3.8  CL 104  --  98 97*  CO2 41*  --  39* 36*  GLUCOSE 157*  --  150* 142*  BUN 51*  --  46* 39*  CREATININE 0.86  --  0.70 0.65  CALCIUM 8.9  --  8.8* 8.5*    ABG: No results for input(s): PHART, PCO2ART, PO2ART, HCO3, O2SAT in the last 168 hours.  Liver Function Tests: No results for input(s): AST, ALT, ALKPHOS, BILITOT, PROT, ALBUMIN in the last 168 hours. No results for input(s): LIPASE, AMYLASE in the last 168 hours. No results for input(s): AMMONIA in the last 168 hours.  CBC: Recent Labs  Lab 03/22/18 1029 03/26/18 0506  WBC 8.3 8.2  HGB 13.4 12.1  HCT 46.2* 40.5  MCV 100.4* 97.8  PLT 303 266    Cardiac Enzymes: No results for input(s): CKTOTAL, CKMB,  CKMBINDEX, TROPONINI in the last 168 hours.  BNP (last 3 results) No results for input(s): BNP in the last 8760 hours.  ProBNP (last 3 results) No results for input(s): PROBNP in the last 8760 hours.  Radiological Exams: No results found.  Assessment/Plan Active Problems:   Acute on chronic respiratory failure with hypoxia (HCC)   Lobar pneumonia (HCC)   Tracheostomy status (HCC)   Hemothorax   Severe sepsis (HCC)   Cardiac arrest (HCC)   1. Acute on chronic respiratory failure with hypoxia continue to wean OxyContin as tolerated continue with aggressive pulmonary toilet patient is at baseline with the T collar and PMV hopefully 2. Lobar pneumonia treated we will continue to monitor 3. Tracheostomy remains in place 4. Pneumothorax resolved 5. Severe sepsis resolved 6. Cardiac arrest rhythm stable   I have personally seen and evaluated the patient, evaluated laboratory and imaging results, formulated the assessment and plan and placed orders. The Patient requires high complexity decision making for assessment and support.  Case was discussed on Rounds with the Respiratory Therapy Staff  Yevonne PaxSaadat A Kelliann Pendergraph, MD [facility] Pulmonary Critical Care Medicine Sleep Medicine

## 2018-03-28 DIAGNOSIS — J181 Lobar pneumonia, unspecified organism: Secondary | ICD-10-CM | POA: Diagnosis not present

## 2018-03-28 DIAGNOSIS — J9621 Acute and chronic respiratory failure with hypoxia: Secondary | ICD-10-CM | POA: Diagnosis not present

## 2018-03-28 DIAGNOSIS — J942 Hemothorax: Secondary | ICD-10-CM | POA: Diagnosis not present

## 2018-03-28 DIAGNOSIS — I469 Cardiac arrest, cause unspecified: Secondary | ICD-10-CM | POA: Diagnosis not present

## 2018-03-28 LAB — URINE CULTURE

## 2018-03-28 LAB — PROTIME-INR
INR: 2.5
Prothrombin Time: 26.7 seconds — ABNORMAL HIGH (ref 11.4–15.2)

## 2018-03-28 LAB — TROPONIN I: Troponin I: <0.03 ng/mL (ref <0.03)

## 2018-03-28 LAB — CK TOTAL AND CKMB (NOT AT ARMC)
CK TOTAL: 18 U/L — AB (ref 38–234)
CK, MB: 1.1 ng/mL (ref 0.5–5.0)
Relative Index: INVALID (ref 0.0–2.5)

## 2018-03-28 NOTE — Progress Notes (Signed)
[facility]   PULMONARY CRITICAL CARE SERVICE  PROGRESS NOTE  Date of Service: 03/28/2018  Melissa Yates  ZOX:[id number]:[id number]  DOB: 08-25-1955   DOA: 03/01/2018  Referring Physician: Carron CurieAli Hijazi, MD  HPI: Melissa MansDonna H Bickhart is a 62 y.o. female seen for follow up of Acute on Chronic Respiratory Failure.  She is doing well at baseline remains on her T collar  Medications: Reviewed on Rounds  Physical Exam:  Vitals: Temperature 96.5 pulse 94 respiratory 22 blood pressure 105/65 saturation 97%  Ventilator Settings off the ventilator right now is on T collar  . General: Comfortable at this time . Eyes: Grossly normal lids, irises & conjunctiva . ENT: grossly tongue is normal . Neck: no obvious mass . Cardiovascular: S1 S2 normal no gallop . Respiratory: No rhonchi no rales . Abdomen: soft . Skin: no rash seen on limited exam . Musculoskeletal: not rigid . Psychiatric:unable to assess . Neurologic: no seizure no involuntary movements         Lab Data:   Basic Metabolic Panel: Recent Labs  Lab 03/22/18 1159 03/23/18 0608 03/24/18 0624 03/25/18 0724  NA 153* 149* 146* 142  K 3.2*  --  3.0* 3.8  CL 104  --  98 97*  CO2 41*  --  39* 36*  GLUCOSE 157*  --  150* 142*  BUN 51*  --  46* 39*  CREATININE 0.86  --  0.70 0.65  CALCIUM 8.9  --  8.8* 8.5*    ABG: No results for input(s): PHART, PCO2ART, PO2ART, HCO3, O2SAT in the last 168 hours.  Liver Function Tests: No results for input(s): AST, ALT, ALKPHOS, BILITOT, PROT, ALBUMIN in the last 168 hours. No results for input(s): LIPASE, AMYLASE in the last 168 hours. No results for input(s): AMMONIA in the last 168 hours.  CBC: Recent Labs  Lab 03/22/18 1029 03/26/18 0506  WBC 8.3 8.2  HGB 13.4 12.1  HCT 46.2* 40.5  MCV 100.4* 97.8  PLT 303 266    Cardiac Enzymes: No results for input(s): CKTOTAL, CKMB, CKMBINDEX, TROPONINI in the last 168 hours.  BNP (last 3  results) No results for input(s): BNP in the last 8760 hours.  ProBNP (last 3 results) No results for input(s): PROBNP in the last 8760 hours.  Radiological Exams: No results found.  Assessment/Plan Active Problems:   Acute on chronic respiratory failure with hypoxia (HCC)   Lobar pneumonia (HCC)   Tracheostomy status (HCC)   Hemothorax   Severe sepsis (HCC)   Cardiac arrest (HCC)   1. Acute on chronic respiratory failure with hypoxia she is now at her baseline with the trach in place we will continue with supportive care 2. Continue present management hemothorax resolved 3. Severe sepsis at baseline 4. Cardiac arrest rhythm is stable 5. Tracheostomy will remain in place   I have personally seen and evaluated the patient, evaluated laboratory and imaging results, formulated the assessment and plan and placed orders. The Patient requires high complexity decision making for assessment and support.  Case was discussed on Rounds with the Respiratory Therapy Staff  Yevonne PaxSaadat A Khan, MD [facility] Pulmonary Critical Care Medicine Sleep Medicine

## 2018-03-29 DIAGNOSIS — J181 Lobar pneumonia, unspecified organism: Secondary | ICD-10-CM | POA: Diagnosis not present

## 2018-03-29 DIAGNOSIS — J942 Hemothorax: Secondary | ICD-10-CM | POA: Diagnosis not present

## 2018-03-29 DIAGNOSIS — J9621 Acute and chronic respiratory failure with hypoxia: Secondary | ICD-10-CM | POA: Diagnosis not present

## 2018-03-29 DIAGNOSIS — I469 Cardiac arrest, cause unspecified: Secondary | ICD-10-CM | POA: Diagnosis not present

## 2018-03-29 LAB — PROTIME-INR
INR: 2.3
PROTHROMBIN TIME: 25 s — AB (ref 11.4–15.2)

## 2018-03-29 LAB — CK TOTAL AND CKMB (NOT AT ARMC)
CK, MB: 1.4 ng/mL (ref 0.5–5.0)
CK, MB: 1.7 ng/mL (ref 0.5–5.0)
RELATIVE INDEX: INVALID (ref 0.0–2.5)
Relative Index: INVALID (ref 0.0–2.5)
Total CK: 23 U/L — ABNORMAL LOW (ref 38–234)
Total CK: 36 U/L — ABNORMAL LOW (ref 38–234)

## 2018-03-29 LAB — TROPONIN I
Troponin I: <0.03 ng/mL (ref <0.03)
Troponin I: <0.03 ng/mL (ref <0.03)

## 2018-03-29 NOTE — Progress Notes (Signed)
[facility]   PULMONARY CRITICAL CARE SERVICE  PROGRESS NOTE  Date of Service: 03/29/2018  Melissa MansDonna H Rish  WUJ:[id number]:[id number]  DOB: 04-11-1956   DOA: 03/01/2018  Referring Physician: Carron CurieAli Hijazi, MD  HPI: Melissa Yates is a 62 y.o. female seen for follow up of Acute on Chronic Respiratory Failure.  Patient was scheduled for discharge today however was found to have some cellulitis of the PEG site so discharge being held  Medications: Reviewed on Rounds  Physical Exam:  Vitals: Temperature 97.2 pulse 86 respiratory 19 blood pressure 131/77 saturations 98%  Ventilator Settings patient is currently off the ventilator on T collar  . General: Comfortable at this time . Eyes: Grossly normal lids, irises & conjunctiva . ENT: grossly tongue is normal . Neck: no obvious mass . Cardiovascular: S1 S2 normal no gallop . Respiratory: Coarse breath sounds no rhonchi . Abdomen: soft . Skin: no rash seen on limited exam . Musculoskeletal: not rigid . Psychiatric:unable to assess . Neurologic: no seizure no involuntary movements         Lab Data:   Basic Metabolic Panel: Recent Labs  Lab 03/23/18 0608 03/24/18 0624 03/25/18 0724  NA 149* 146* 142  K  --  3.0* 3.8  CL  --  98 97*  CO2  --  39* 36*  GLUCOSE  --  150* 142*  BUN  --  46* 39*  CREATININE  --  0.70 0.65  CALCIUM  --  8.8* 8.5*    ABG: No results for input(s): PHART, PCO2ART, PO2ART, HCO3, O2SAT in the last 168 hours.  Liver Function Tests: No results for input(s): AST, ALT, ALKPHOS, BILITOT, PROT, ALBUMIN in the last 168 hours. No results for input(s): LIPASE, AMYLASE in the last 168 hours. No results for input(s): AMMONIA in the last 168 hours.  CBC: Recent Labs  Lab 03/26/18 0506  WBC 8.2  HGB 12.1  HCT 40.5  MCV 97.8  PLT 266    Cardiac Enzymes: Recent Labs  Lab 03/28/18 2106 03/29/18 0239 03/29/18 0821 03/29/18 1118  CKTOTAL 18* 23* 36*   --   CKMB 1.1 1.4 1.7  --   TROPONINI <0.03 <0.03 <0.03 <0.03    BNP (last 3 results) No results for input(s): BNP in the last 8760 hours.  ProBNP (last 3 results) No results for input(s): PROBNP in the last 8760 hours.  Radiological Exams: No results found.  Assessment/Plan Active Problems:   Acute on chronic respiratory failure with hypoxia (HCC)   Lobar pneumonia (HCC)   Tracheostomy status (HCC)   Hemothorax   Severe sepsis (HCC)   Cardiac arrest (HCC)   1. Acute on chronic respiratory failure with hypoxia resolved patient is back to her baseline on T collar 2. Lobar pneumonia treated resolved 3. Tracheostomy will remain in place 4. Hemothorax resolved 5. Severe sepsis resolved 6. Cardiac arrest rhythm is been stable   I have personally seen and evaluated the patient, evaluated laboratory and imaging results, formulated the assessment and plan and placed orders. The Patient requires high complexity decision making for assessment and support.  Case was discussed on Rounds with the Respiratory Therapy Staff  Yevonne PaxSaadat A Landyn Lorincz, MD [facility] Pulmonary Critical Care Medicine Sleep Medicine

## 2018-03-30 DIAGNOSIS — J181 Lobar pneumonia, unspecified organism: Secondary | ICD-10-CM | POA: Diagnosis not present

## 2018-03-30 DIAGNOSIS — I469 Cardiac arrest, cause unspecified: Secondary | ICD-10-CM | POA: Diagnosis not present

## 2018-03-30 DIAGNOSIS — J9621 Acute and chronic respiratory failure with hypoxia: Secondary | ICD-10-CM | POA: Diagnosis not present

## 2018-03-30 DIAGNOSIS — J942 Hemothorax: Secondary | ICD-10-CM | POA: Diagnosis not present

## 2018-03-30 LAB — PROTIME-INR
INR: 2.39
Prothrombin Time: 25.7 seconds — ABNORMAL HIGH (ref 11.4–15.2)

## 2018-03-30 NOTE — Progress Notes (Signed)
[facility]   PULMONARY CRITICAL CARE SERVICE  PROGRESS NOTE  Date of Service: 03/30/2018  Celine MansDonna H Chenard  NWG:[id number]:[id number]  DOB: 03-25-1956   DOA: 03/01/2018  Referring Physician: Carron CurieAli Hijazi, MD  HPI: Celine MansDonna H Hiltz is a 62 y.o. female seen for follow up of Acute on Chronic Respiratory Failure.  Patient is on T collar she is at her baseline using the PMV at baseline  Medications: Reviewed on Rounds  Physical Exam:  Vitals: Temperature 97.0 pulse 94 respiratory rate 19 blood pressure 121/88 saturations 96%  Ventilator Settings currently is on T collar FiO2 28%  . General: Comfortable at this time . Eyes: Grossly normal lids, irises & conjunctiva . ENT: grossly tongue is normal . Neck: no obvious mass . Cardiovascular: S1 S2 normal no gallop . Respiratory: No rhonchi or rales are noted . Abdomen: soft . Skin: no rash seen on limited exam . Musculoskeletal: not rigid . Psychiatric:unable to assess . Neurologic: no seizure no involuntary movements         Lab Data:   Basic Metabolic Panel: Recent Labs  Lab 03/24/18 0624 03/25/18 0724  NA 146* 142  K 3.0* 3.8  CL 98 97*  CO2 39* 36*  GLUCOSE 150* 142*  BUN 46* 39*  CREATININE 0.70 0.65  CALCIUM 8.8* 8.5*    ABG: No results for input(s): PHART, PCO2ART, PO2ART, HCO3, O2SAT in the last 168 hours.  Liver Function Tests: No results for input(s): AST, ALT, ALKPHOS, BILITOT, PROT, ALBUMIN in the last 168 hours. No results for input(s): LIPASE, AMYLASE in the last 168 hours. No results for input(s): AMMONIA in the last 168 hours.  CBC: Recent Labs  Lab 03/26/18 0506  WBC 8.2  HGB 12.1  HCT 40.5  MCV 97.8  PLT 266    Cardiac Enzymes: Recent Labs  Lab 03/28/18 2106 03/29/18 0239 03/29/18 0821 03/29/18 1118  CKTOTAL 18* 23* 36*  --   CKMB 1.1 1.4 1.7  --   TROPONINI <0.03 <0.03 <0.03 <0.03    BNP (last 3 results) No results for input(s): BNP  in the last 8760 hours.  ProBNP (last 3 results) No results for input(s): PROBNP in the last 8760 hours.  Radiological Exams: No results found.  Assessment/Plan Active Problems:   Acute on chronic respiratory failure with hypoxia (HCC)   Lobar pneumonia (HCC)   Tracheostomy status (HCC)   Hemothorax   Severe sepsis (HCC)   Cardiac arrest (HCC)   1. Acute on chronic respiratory failure with hypoxia we will continue with T collar trials continue secretion management pulmonary toilet. 2. Lobar pneumonia treated we will continue to follow along 3. Tracheostomy remains in place 4. Hemothorax at baseline we will continue to monitor. 5. Severe sepsis improved we will continue with supportive care 6. Cardiac arrest rhythm is stable   I have personally seen and evaluated the patient, evaluated laboratory and imaging results, formulated the assessment and plan and placed orders. The Patient requires high complexity decision making for assessment and support.  Case was discussed on Rounds with the Respiratory Therapy Staff  Yevonne PaxSaadat A Malyn Aytes, MD [facility] Pulmonary Critical Care Medicine Sleep Medicine

## 2018-03-31 DIAGNOSIS — J942 Hemothorax: Secondary | ICD-10-CM | POA: Diagnosis not present

## 2018-03-31 DIAGNOSIS — J181 Lobar pneumonia, unspecified organism: Secondary | ICD-10-CM | POA: Diagnosis not present

## 2018-03-31 DIAGNOSIS — I469 Cardiac arrest, cause unspecified: Secondary | ICD-10-CM | POA: Diagnosis not present

## 2018-03-31 DIAGNOSIS — J9621 Acute and chronic respiratory failure with hypoxia: Secondary | ICD-10-CM | POA: Diagnosis not present

## 2018-03-31 LAB — PROTIME-INR
INR: 1.98
Prothrombin Time: 22.2 seconds — ABNORMAL HIGH (ref 11.4–15.2)

## 2018-03-31 NOTE — Progress Notes (Signed)
[facility]   PULMONARY CRITICAL CARE SERVICE  PROGRESS NOTE  Date of Service: 03/31/2018  Melissa Yates  ZOX:[id number]  DOB: [birth year]   DOA: 03/01/2018  Referring Physician: Carron CurieAli Hijazi, MD  HPI: Melissa MansDonna H Fraser is a 62 y.o. female seen for follow up of Acute on Chronic Respiratory Failure.  Currently patient is on T collar has been on 28% oxygen good saturations are noted at this time.  Medications: Reviewed on Rounds  Physical Exam:  Vitals: Temperature 98.7 pulse 90 respiratory rate 24 blood pressure 106/65 saturations 98%  Ventilator Settings currently off the ventilator on T collar with the PMV  . General: Comfortable at this time . Eyes: Grossly normal lids, irises & conjunctiva . ENT: grossly tongue is normal . Neck: no obvious mass . Cardiovascular: S1 S2 normal no gallop . Respiratory: No rhonchi or rales are noted . Abdomen: soft . Skin: no rash seen on limited exam . Musculoskeletal: not rigid . Psychiatric:unable to assess . Neurologic: no seizure no involuntary movements         Lab Data:   Basic Metabolic Panel: Recent Labs  Lab 03/25/18 0724  NA 142  K 3.8  CL 97*  CO2 36*  GLUCOSE 142*  BUN 39*  CREATININE 0.65  CALCIUM 8.5*    ABG: No results for input(s): PHART, PCO2ART, PO2ART, HCO3, O2SAT in the last 168 hours.  Liver Function Tests: No results for input(s): AST, ALT, ALKPHOS, BILITOT, PROT, ALBUMIN in the last 168 hours. No results for input(s): LIPASE, AMYLASE in the last 168 hours. No results for input(s): AMMONIA in the last 168 hours.  CBC: Recent Labs  Lab 03/26/18 0506  WBC 8.2  HGB 12.1  HCT 40.5  MCV 97.8  PLT 266    Cardiac Enzymes: Recent Labs  Lab 03/28/18 2106 03/29/18 0239 03/29/18 0821 03/29/18 1118  CKTOTAL 18* 23* 36*  --   CKMB 1.1 1.4 1.7  --   TROPONINI <0.03 <0.03 <0.03 <0.03    BNP (last 3 results) No results for input(s): BNP in  the last 8760 hours.  ProBNP (last 3 results) No results for input(s): PROBNP in the last 8760 hours.  Radiological Exams: No results found.  Assessment/Plan Active Problems:   Acute on chronic respiratory failure with hypoxia (HCC)   Lobar pneumonia (HCC)   Tracheostomy status (HCC)   Hemothorax   Severe sepsis (HCC)   Cardiac arrest (HCC)   1. Acute on chronic respiratory failure with hypoxia we will continue with T collar trials continue pulmonary toilet supportive care. 2. Lobar pneumonia treated we will continue to monitor 3. Tracheostomy at baseline 4. Pneumothorax no active bleeding noted 5. Severe sepsis resolved 6. Cardiac arrest rhythm is stable   I have personally seen and evaluated the patient, evaluated laboratory and imaging results, formulated the assessment and plan and placed orders. The Patient requires high complexity decision making for assessment and support.  Case was discussed on Rounds with the Respiratory Therapy Staff  Yevonne PaxSaadat A Kyrese Gartman, MD [facility] Pulmonary Critical Care Medicine Sleep Medicine

## 2018-04-01 ENCOUNTER — Other Ambulatory Visit (HOSPITAL_COMMUNITY): Payer: Medicare Other

## 2018-04-01 DIAGNOSIS — I469 Cardiac arrest, cause unspecified: Secondary | ICD-10-CM | POA: Diagnosis not present

## 2018-04-01 DIAGNOSIS — J9621 Acute and chronic respiratory failure with hypoxia: Secondary | ICD-10-CM | POA: Diagnosis not present

## 2018-04-01 DIAGNOSIS — J181 Lobar pneumonia, unspecified organism: Secondary | ICD-10-CM | POA: Diagnosis not present

## 2018-04-01 DIAGNOSIS — J942 Hemothorax: Secondary | ICD-10-CM | POA: Diagnosis not present

## 2018-04-01 LAB — PROTIME-INR
INR: 1.83
Prothrombin Time: 20.9 seconds — ABNORMAL HIGH (ref 11.4–15.2)

## 2018-04-01 NOTE — Progress Notes (Signed)
Pulmonary Critical Care [facility]   PULMONARY CRITICAL CARE SERVICE  PROGRESS NOTE  Date of Service: 04/01/2018  Melissa Yates  UJW:[id number]:[id number]  DOB: October 06, 1955   DOA: 03/01/2018  Referring Physician: Carron CurieAli Hijazi, MD  HPI: Melissa Yates is a 62 y.o. female seen for follow up of Acute on Chronic Respiratory Failure.  She is on T collar at baseline.  Awaiting discharge planning right now is comfortable without distress.  Medications: Reviewed on Rounds  Physical Exam:  Vitals: Temperature 98.4 pulse 87 respiratory rate 16 blood pressure 122/68 saturations 97%  Ventilator Settings on T collar FiO2 28%  . General: Comfortable at this time . Eyes: Grossly normal lids, irises & conjunctiva . ENT: grossly tongue is normal . Neck: no obvious mass . Cardiovascular: S1 S2 normal no gallop . Respiratory: No rhonchi no rales . Abdomen: soft . Skin: no rash seen on limited exam . Musculoskeletal: not rigid . Psychiatric:unable to assess . Neurologic: no seizure no involuntary movements         Lab Data:   Basic Metabolic Panel: No results for input(s): NA, K, CL, CO2, GLUCOSE, BUN, CREATININE, CALCIUM, MG, PHOS in the last 168 hours.  ABG: No results for input(s): PHART, PCO2ART, PO2ART, HCO3, O2SAT in the last 168 hours.  Liver Function Tests: No results for input(s): AST, ALT, ALKPHOS, BILITOT, PROT, ALBUMIN in the last 168 hours. No results for input(s): LIPASE, AMYLASE in the last 168 hours. No results for input(s): AMMONIA in the last 168 hours.  CBC: Recent Labs  Lab 03/26/18 0506  WBC 8.2  HGB 12.1  HCT 40.5  MCV 97.8  PLT 266    Cardiac Enzymes: Recent Labs  Lab 03/28/18 2106 03/29/18 0239 03/29/18 0821 03/29/18 1118  CKTOTAL 18* 23* 36*  --   CKMB 1.1 1.4 1.7  --   TROPONINI <0.03 <0.03 <0.03 <0.03    BNP (last 3 results) No results for input(s): BNP in the last 8760 hours.  ProBNP (last 3 results) No results  for input(s): PROBNP in the last 8760 hours.  Radiological Exams: [place] Abdomen Limited  Result Date: 04/01/2018 CLINICAL DATA:  Jejunal tube site for abscess. EXAM: ULTRASOUND ABDOMEN LIMITED COMPARISON:  None. FINDINGS: Sonographic imaging around the jejunostomy tube site was performed. There is an ill-defined hypoechoic complex fluid collection surrounding the jejunostomy tube within the soft tissues measuring 1.7 x 3.2 x 0.5 cm. IMPRESSION: There is a small complex fluid collection surrounding the jejunostomy tube within the soft tissues measuring 1.7 x 3.2 x 0.5 cm. This is nonspecific. An inflammatory process is not excluded. Electronically Signed   By: Jolaine ClickArthur  Hoss M.D.   On: 04/01/2018 09:29    Assessment/Plan Active Problems:   Acute on chronic respiratory failure with hypoxia (HCC)   Lobar pneumonia (HCC)   Tracheostomy status (HCC)   Hemothorax   Severe sepsis (HCC)   Cardiac arrest (HCC)   1. Acute on chronic respiratory failure with hypoxia we will continue with oxygen therapy at the present settings continue with aggressive pulmonary toilet. 2. Lobar pneumonia treated clinically improved we will monitor 3. Tracheostomy chronic will remain in place 4. Hemothorax resolved 5. Severe sepsis hemodynamically stable 6. Cardiac arrest rhythm is stable   I have personally seen and evaluated the patient, evaluated laboratory and imaging results, formulated the assessment and plan and placed orders. The Patient requires high complexity decision making for assessment and support.  Case was discussed on Rounds with the Respiratory  Therapy Staff  Allyne Gee, MD [facility] Pulmonary Critical Care Medicine Sleep Medicine

## 2018-04-02 DIAGNOSIS — J181 Lobar pneumonia, unspecified organism: Secondary | ICD-10-CM | POA: Diagnosis not present

## 2018-04-02 DIAGNOSIS — I469 Cardiac arrest, cause unspecified: Secondary | ICD-10-CM | POA: Diagnosis not present

## 2018-04-02 DIAGNOSIS — J9621 Acute and chronic respiratory failure with hypoxia: Secondary | ICD-10-CM | POA: Diagnosis not present

## 2018-04-02 DIAGNOSIS — J942 Hemothorax: Secondary | ICD-10-CM | POA: Diagnosis not present

## 2018-04-02 LAB — PROTIME-INR
INR: 1.73
Prothrombin Time: 20.1 seconds — ABNORMAL HIGH (ref 11.4–15.2)

## 2018-04-02 NOTE — Progress Notes (Signed)
[facility]   PULMONARY CRITICAL CARE SERVICE  PROGRESS NOTE  Date of Service: 04/02/2018  Melissa Yates  BJY:[id number]:[id number]  DOB: May 04, 1955   DOA: 03/01/2018  Referring Physician: Carron CurieAli Hijazi, MD  HPI: Melissa MansDonna H Yates is a 62 y.o. female seen for follow up of Acute on Chronic Respiratory Failure.  Patient is on T collar she is at her baseline currently 28% FiO2  Medications: Reviewed on Rounds  Physical Exam:  Vitals: Temperature 98.1 pulse 70 respiratory 18 blood pressure 110/52 saturations 96%  Ventilator Settings off the ventilator on T collar  . General: Comfortable at this time . Eyes: Grossly normal lids, irises & conjunctiva . ENT: grossly tongue is normal . Neck: no obvious mass . Cardiovascular: S1 S2 normal no gallop . Respiratory: Coarse rhonchi bilaterally . Abdomen: soft . Skin: no rash seen on limited exam . Musculoskeletal: not rigid . Psychiatric:unable to assess . Neurologic: no seizure no involuntary movements         Lab Data:   Basic Metabolic Panel: No results for input(s): NA, K, CL, CO2, GLUCOSE, BUN, CREATININE, CALCIUM, MG, PHOS in the last 168 hours.  ABG: No results for input(s): PHART, PCO2ART, PO2ART, HCO3, O2SAT in the last 168 hours.  Liver Function Tests: No results for input(s): AST, ALT, ALKPHOS, BILITOT, PROT, ALBUMIN in the last 168 hours. No results for input(s): LIPASE, AMYLASE in the last 168 hours. No results for input(s): AMMONIA in the last 168 hours.  CBC: No results for input(s): WBC, NEUTROABS, HGB, HCT, MCV, PLT in the last 168 hours.  Cardiac Enzymes: Recent Labs  Lab 03/28/18 2106 03/29/18 0239 03/29/18 0821 03/29/18 1118  CKTOTAL 18* 23* 36*  --   CKMB 1.1 1.4 1.7  --   TROPONINI <0.03 <0.03 <0.03 <0.03    BNP (last 3 results) No results for input(s): BNP in the last 8760 hours.  ProBNP (last 3 results) No results for input(s): PROBNP in the last  8760 hours.  Radiological Exams: [place] Abdomen Limited  Result Date: 04/01/2018 CLINICAL DATA:  Jejunal tube site for abscess. EXAM: ULTRASOUND ABDOMEN LIMITED COMPARISON:  None. FINDINGS: Sonographic imaging around the jejunostomy tube site was performed. There is an ill-defined hypoechoic complex fluid collection surrounding the jejunostomy tube within the soft tissues measuring 1.7 x 3.2 x 0.5 cm. IMPRESSION: There is a small complex fluid collection surrounding the jejunostomy tube within the soft tissues measuring 1.7 x 3.2 x 0.5 cm. This is nonspecific. An inflammatory process is not excluded. Electronically Signed   By: Jolaine ClickArthur  Hoss M.D.   On: 04/01/2018 09:29    Assessment/Plan Active Problems:   Acute on chronic respiratory failure with hypoxia (HCC)   Lobar pneumonia (HCC)   Tracheostomy status (HCC)   Hemothorax   Severe sepsis (HCC)   Cardiac arrest (HCC)   1. Acute on chronic respiratory failure with hypoxia we will continue with her baseline T collar and use the PMV as tolerated. 2. Lobar pneumonia treated we will monitor 3. Tracheostomy remains in place 4. Hemothorax at baseline 5. Severe sepsis patient is hemodynamically stable 6. Cardiac arrest rhythm stable at this time   I have personally seen and evaluated the patient, evaluated laboratory and imaging results, formulated the assessment and plan and placed orders. The Patient requires high complexity decision making for assessment and support.  Case was discussed on Rounds with the Respiratory Therapy Staff  Yevonne PaxSaadat A Khan, MD [facility] Pulmonary Critical Care Medicine Sleep  Medicine

## 2018-04-03 DIAGNOSIS — I469 Cardiac arrest, cause unspecified: Secondary | ICD-10-CM | POA: Diagnosis not present

## 2018-04-03 DIAGNOSIS — J181 Lobar pneumonia, unspecified organism: Secondary | ICD-10-CM | POA: Diagnosis not present

## 2018-04-03 DIAGNOSIS — J9621 Acute and chronic respiratory failure with hypoxia: Secondary | ICD-10-CM | POA: Diagnosis not present

## 2018-04-03 DIAGNOSIS — J942 Hemothorax: Secondary | ICD-10-CM | POA: Diagnosis not present

## 2018-04-03 LAB — PROTIME-INR
INR: 1.3
Prothrombin Time: 16 seconds — ABNORMAL HIGH (ref 11.4–15.2)

## 2018-04-03 NOTE — Progress Notes (Signed)
[facility]ulmonary Critical [facility]   PULMONARY CRITICAL CARE SERVICE  PROGRESS NOTE  Date of Service: 04/03/2018  Melissa MansDonna H Quintanar  ZOX:[id number]:[id number]  DOB: [birth year]   DOA: 03/01/2018  Referring Physician: Carron CurieAli Hijazi, MD  HPI: Melissa Yates is a [age] y.o. female seen for follow up of Acute on Chronic Respiratory Failure.  Patient is doing about the same she is on T collar currently has been on 28% oxygen good saturations are noted  Medications: Reviewed on Rounds  Physical Exam:  Vitals: Temperature 97.0 pulse 76 respiratory rate 27 blood pressure 117/68 saturations 96%  Ventilator Settings off the ventilator on T collar rate now  . General: Comfortable at this time . Eyes: Grossly normal lids, irises & conjunctiva . ENT: grossly tongue is normal . Neck: no obvious mass . Cardiovascular: S1 S2 normal no gallop . Respiratory: Coarse breath sounds with few rhonchi . Abdomen: soft . Skin: no rash seen on limited exam . Musculoskeletal: not rigid . Psychiatric:unable to assess . Neurologic: no seizure no involuntary movements         Lab Data:   Basic Metabolic Panel: No results for input(s): NA, K, CL, CO2, GLUCOSE, BUN, CREATININE, CALCIUM, MG, PHOS in the last 168 hours.  ABG: No results for input(s): PHART, PCO2ART, PO2ART, HCO3, O2SAT in the last 168 hours.  Liver Function Tests: No results for input(s): AST, ALT, ALKPHOS, BILITOT, PROT, ALBUMIN in the last 168 hours. No results for input(s): LIPASE, AMYLASE in the last 168 hours. No results for input(s): AMMONIA in the last 168 hours.  CBC: No results for input(s): WBC, NEUTROABS, HGB, HCT, MCV, PLT in the last 168 hours.  Cardiac Enzymes: Recent Labs  Lab 03/28/18 2106 03/29/18 0239 03/29/18 0821 03/29/18 1118  CKTOTAL 18* 23* 36*  --   CKMB 1.1 1.4 1.7  --   TROPONINI <0.03 <0.03 <0.03 <0.03    BNP (last 3 results) No results for input(s): BNP in the last 8760  hours.  ProBNP (last 3 results) No results for input(s): PROBNP in the last 8760 hours.  Radiological Exams: No results found.  Assessment/Plan Active Problems:   Acute on chronic respiratory failure with hypoxia (HCC)   Lobar pneumonia (HCC)   Tracheostomy status (HCC)   Hemothorax   Severe sepsis (HCC)   Cardiac arrest (HCC)   1. Acute on chronic respiratory failure with hypoxia continue with weaning on T collar trials with the PMV.  Continue secretion management pulmonary toilet 2. Lobar pneumonia treated clinically improved 3. Hemothorax resolved 4. Severe sepsis resolved hemodynamically stable 5. Cardiac arrest rhythm is stable   I have personally seen and evaluated the patient, evaluated laboratory and imaging results, formulated the assessment and plan and placed orders. The Patient requires high complexity decision making for assessment and support.  Case was discussed on Rounds with the Respiratory Therapy Staff  Yevonne PaxSaadat A , MD [facility] Pulmonary Critical Care Medicine Sleep Medicine

## 2018-04-04 DIAGNOSIS — J942 Hemothorax: Secondary | ICD-10-CM | POA: Diagnosis not present

## 2018-04-04 DIAGNOSIS — I469 Cardiac arrest, cause unspecified: Secondary | ICD-10-CM | POA: Diagnosis not present

## 2018-04-04 DIAGNOSIS — J9621 Acute and chronic respiratory failure with hypoxia: Secondary | ICD-10-CM | POA: Diagnosis not present

## 2018-04-04 DIAGNOSIS — J181 Lobar pneumonia, unspecified organism: Secondary | ICD-10-CM | POA: Diagnosis not present

## 2018-04-04 LAB — PROTIME-INR
INR: 1.22
PROTHROMBIN TIME: 15.3 s — AB (ref 11.4–15.2)

## 2018-04-04 NOTE — Progress Notes (Signed)
[facility]   PULMONARY CRITICAL CARE SERVICE  PROGRESS NOTE  Date of Service: 04/04/2018  Melissa MansDonna H Bensen  ZOX:[id number]  DOB: [birth year]   DOA: 03/01/2018  Referring Physician: Carron CurieAli Hijazi, MD  HPI: Melissa Yates is a 62 y.o. female seen for follow up of Acute on Chronic Respiratory Failure.  Patient is on T collar she is at her baseline right now is on 28% oxygen good saturations are noted.  Medications: Reviewed on Rounds  Physical Exam:  Vitals: Temperature 97.0 pulse 87 respiratory 19 blood pressure 113/63 saturation 95%  Ventilator Settings off the ventilator on T collar trials  . General: Comfortable at this time . Eyes: Grossly normal lids, irises & conjunctiva . ENT: grossly tongue is normal . Neck: no obvious mass . Cardiovascular: S1 S2 normal no gallop . Respiratory: Coarse breath sounds with few rhonchi . Abdomen: soft . Skin: no rash seen on limited exam . Musculoskeletal: not rigid . Psychiatric:unable to assess . Neurologic: no seizure no involuntary movements         Lab Data:   Basic Metabolic Panel: No results for input(s): NA, K, CL, CO2, GLUCOSE, BUN, CREATININE, CALCIUM, MG, PHOS in the last 168 hours.  ABG: No results for input(s): PHART, PCO2ART, PO2ART, HCO3, O2SAT in the last 168 hours.  Liver Function Tests: No results for input(s): AST, ALT, ALKPHOS, BILITOT, PROT, ALBUMIN in the last 168 hours. No results for input(s): LIPASE, AMYLASE in the last 168 hours. No results for input(s): AMMONIA in the last 168 hours.  CBC: No results for input(s): WBC, NEUTROABS, HGB, HCT, MCV, PLT in the last 168 hours.  Cardiac Enzymes: Recent Labs  Lab 03/28/18 2106 03/29/18 0239 03/29/18 0821 03/29/18 1118  CKTOTAL 18* 23* 36*  --   CKMB 1.1 1.4 1.7  --   TROPONINI <0.03 <0.03 <0.03 <0.03    BNP (last 3 results) No results for input(s): BNP in the last 8760 hours.  ProBNP (last 3  results) No results for input(s): PROBNP in the last 8760 hours.  Radiological Exams: No results found.  Assessment/Plan Active Problems:   Acute on chronic respiratory failure with hypoxia (HCC)   Lobar pneumonia (HCC)   Tracheostomy status (HCC)   Hemothorax   Severe sepsis (HCC)   Cardiac arrest (HCC)   1. Acute on chronic respiratory failure with hypoxia we will continue with T collar continue pulmonary toilet supportive care. 2. Pulmonary pneumonia resolved 3. Tracheostomy permanent will remain in place 4. Pneumothorax resolved 5. Severe sepsis resolved 6. Cardiac arrest rhythm is stable   I have personally seen and evaluated the patient, evaluated laboratory and imaging results, formulated the assessment and plan and placed orders. The Patient requires high complexity decision making for assessment and support.  Case was discussed on Rounds with the Respiratory Therapy Staff  Yevonne PaxSaadat A Yaseen Gilberg, MD [facility] Pulmonary Critical Care Medicine Sleep Medicine

## 2018-04-05 DIAGNOSIS — I469 Cardiac arrest, cause unspecified: Secondary | ICD-10-CM | POA: Diagnosis not present

## 2018-04-05 DIAGNOSIS — J9621 Acute and chronic respiratory failure with hypoxia: Secondary | ICD-10-CM | POA: Diagnosis not present

## 2018-04-05 DIAGNOSIS — J181 Lobar pneumonia, unspecified organism: Secondary | ICD-10-CM | POA: Diagnosis not present

## 2018-04-05 DIAGNOSIS — J942 Hemothorax: Secondary | ICD-10-CM | POA: Diagnosis not present

## 2018-04-05 LAB — PROTIME-INR
INR: 1.42
Prothrombin Time: 17.2 seconds — ABNORMAL HIGH (ref 11.4–15.2)

## 2018-04-05 NOTE — Progress Notes (Signed)
[facility]   PULMONARY CRITICAL CARE SERVICE  PROGRESS NOTE  Date of Service: 04/05/2018  Melissa MansDonna H Ebbert  ZOX:[id number]  DOB: 11-20-1955   DOA: 03/01/2018  Referring Physician: Carron CurieAli Hijazi, MD  HPI: Melissa Yates is a 62 y.o. female seen for follow up of Acute on Chronic Respiratory Failure.  Patient is scheduled for possible discharge she remains on T collar looks good  Medications: Reviewed on Rounds  Physical Exam:  Vitals: Temperature 97.4 pulse 97 respiratory rate 11 blood pressure 170/70 saturations 93%  Ventilator Settings off the ventilator on T collar  . General: Comfortable at this time . Eyes: Grossly normal lids, irises & conjunctiva . ENT: grossly tongue is normal . Neck: no obvious mass . Cardiovascular: S1 S2 normal no gallop . Respiratory: Coarse rhonchi expansion equal . Abdomen: soft . Skin: no rash seen on limited exam . Musculoskeletal: not rigid . Psychiatric:unable to assess . Neurologic: no seizure no involuntary movements         Lab Data:   Basic Metabolic Panel: No results for input(s): NA, K, CL, CO2, GLUCOSE, BUN, CREATININE, CALCIUM, MG, PHOS in the last 168 hours.  ABG: No results for input(s): PHART, PCO2ART, PO2ART, HCO3, O2SAT in the last 168 hours.  Liver Function Tests: No results for input(s): AST, ALT, ALKPHOS, BILITOT, PROT, ALBUMIN in the last 168 hours. No results for input(s): LIPASE, AMYLASE in the last 168 hours. No results for input(s): AMMONIA in the last 168 hours.  CBC: No results for input(s): WBC, NEUTROABS, HGB, HCT, MCV, PLT in the last 168 hours.  Cardiac Enzymes: Recent Labs  Lab 03/29/18 1118  TROPONINI <0.03    BNP (last 3 results) No results for input(s): BNP in the last 8760 hours.  ProBNP (last 3 results) No results for input(s): PROBNP in the last 8760 hours.  Radiological Exams: No results found.  Assessment/Plan Active Problems:    Acute on chronic respiratory failure with hypoxia (HCC)   Lobar pneumonia (HCC)   Tracheostomy status (HCC)   Hemothorax   Severe sepsis (HCC)   Cardiac arrest (HCC)   1. Acute on chronic respiratory failure with hypoxia we will continue with weaning on T collar as tolerated continue pulmonary toilet supportive care. 2. Lobar pneumonia treated we will monitor 3. Tracheostomy in place 4. Pneumothorax resolved 5. Severe sepsis resolved 6. Cardiac arrest rhythm is stable   I have personally seen and evaluated the patient, evaluated laboratory and imaging results, formulated the assessment and plan and placed orders. The Patient requires high complexity decision making for assessment and support.  Case was discussed on Rounds with the Respiratory Therapy Staff  Yevonne PaxSaadat A Khan, MD [facility] Pulmonary Critical Care Medicine Sleep Medicine

## 2018-06-01 ENCOUNTER — Inpatient Hospital Stay
Admission: RE | Admit: 2018-06-01 | Discharge: 2018-07-12 | Disposition: A | Discharge status: Skilled Nursing Facility | Payer: Medicare Other | Source: Other Acute Inpatient Hospital | Attending: Internal Medicine | Admitting: Internal Medicine

## 2018-06-01 ENCOUNTER — Other Ambulatory Visit (HOSPITAL_COMMUNITY): Payer: Medicare Other

## 2018-06-01 DIAGNOSIS — R6520 Severe sepsis without septic shock: Secondary | ICD-10-CM

## 2018-06-01 DIAGNOSIS — Q249 Congenital malformation of heart, unspecified: Secondary | ICD-10-CM

## 2018-06-01 DIAGNOSIS — J9621 Acute and chronic respiratory failure with hypoxia: Secondary | ICD-10-CM | POA: Diagnosis present

## 2018-06-01 DIAGNOSIS — R609 Edema, unspecified: Secondary | ICD-10-CM

## 2018-06-01 DIAGNOSIS — S8011XS Contusion of right lower leg, sequela: Secondary | ICD-10-CM

## 2018-06-01 DIAGNOSIS — A419 Sepsis, unspecified organism: Secondary | ICD-10-CM | POA: Diagnosis present

## 2018-06-01 DIAGNOSIS — G8250 Quadriplegia, unspecified: Secondary | ICD-10-CM

## 2018-06-01 DIAGNOSIS — R0602 Shortness of breath: Secondary | ICD-10-CM

## 2018-06-01 DIAGNOSIS — T17908A Unspecified foreign body in respiratory tract, part unspecified causing other injury, initial encounter: Secondary | ICD-10-CM

## 2018-06-01 DIAGNOSIS — T839XXA Unspecified complication of genitourinary prosthetic device, implant and graft, initial encounter: Secondary | ICD-10-CM

## 2018-06-01 DIAGNOSIS — Z930 Tracheostomy status: Secondary | ICD-10-CM

## 2018-06-01 DIAGNOSIS — Z931 Gastrostomy status: Secondary | ICD-10-CM

## 2018-06-01 DIAGNOSIS — Z466 Encounter for fitting and adjustment of urinary device: Secondary | ICD-10-CM

## 2018-06-01 DIAGNOSIS — L0291 Cutaneous abscess, unspecified: Secondary | ICD-10-CM

## 2018-06-01 DIAGNOSIS — J690 Pneumonitis due to inhalation of food and vomit: Secondary | ICD-10-CM

## 2018-06-01 MED ORDER — IOPAMIDOL (ISOVUE-300) INJECTION 61%
INTRAVENOUS | Status: AC
  Administered 2018-06-01: 50 mL via GASTROSTOMY
  Filled 2018-06-01: qty 50

## 2018-06-02 ENCOUNTER — Other Ambulatory Visit (HOSPITAL_COMMUNITY): Payer: Medicare Other

## 2018-06-02 DIAGNOSIS — J9621 Acute and chronic respiratory failure with hypoxia: Secondary | ICD-10-CM

## 2018-06-02 DIAGNOSIS — Z930 Tracheostomy status: Secondary | ICD-10-CM

## 2018-06-02 DIAGNOSIS — G8250 Quadriplegia, unspecified: Secondary | ICD-10-CM

## 2018-06-02 DIAGNOSIS — A419 Sepsis, unspecified organism: Secondary | ICD-10-CM | POA: Diagnosis not present

## 2018-06-02 DIAGNOSIS — J690 Pneumonitis due to inhalation of food and vomit: Secondary | ICD-10-CM

## 2018-06-02 DIAGNOSIS — R6520 Severe sepsis without septic shock: Secondary | ICD-10-CM

## 2018-06-02 LAB — BLOOD GAS, ARTERIAL
ACID-BASE DEFICIT: 0.5 mmol/L (ref 0.0–2.0)
Bicarbonate: 24.1 mmol/L (ref 20.0–28.0)
FIO2: 30
O2 Saturation: 95.6 %
PEEP: 5 cmH2O
PRESSURE CONTROL: 20 cmH2O
Patient temperature: 98.6
RATE: 15 resp/min
pCO2 arterial: 43.2 mmHg (ref 32.0–48.0)
pH, Arterial: 7.366 (ref 7.350–7.450)
pO2, Arterial: 83.8 mmHg (ref 83.0–108.0)

## 2018-06-02 LAB — PROTIME-INR
INR: 2.83
Prothrombin Time: 29.3 seconds — ABNORMAL HIGH (ref 11.4–15.2)

## 2018-06-02 LAB — CBC
HCT: 32.3 % — ABNORMAL LOW (ref 36.0–46.0)
Hemoglobin: 10.2 g/dL — ABNORMAL LOW (ref 12.0–15.0)
MCH: 28.6 pg (ref 26.0–34.0)
MCHC: 31.6 g/dL (ref 30.0–36.0)
MCV: 90.5 fL (ref 80.0–100.0)
Platelets: 316 10*3/uL (ref 150–400)
RBC: 3.57 MIL/uL — ABNORMAL LOW (ref 3.87–5.11)
RDW: 17.1 % — ABNORMAL HIGH (ref 11.5–15.5)
WBC: 10 10*3/uL (ref 4.0–10.5)
nRBC: 0 % (ref 0.0–0.2)

## 2018-06-02 MED ORDER — NOREPINEPHRINE-SODIUM CHLORIDE 8-0.9 MG/250ML-% IV SOLN
0.10 | INTRAVENOUS | Status: DC
Start: ? — End: 2018-06-02

## 2018-06-02 MED ORDER — WARFARIN SODIUM 1 MG PO TABS
ORAL_TABLET | ORAL | Status: DC
Start: ? — End: 2018-06-02

## 2018-06-02 MED ORDER — BACLOFEN 10 MG PO TABS
20.00 | ORAL_TABLET | ORAL | Status: DC
Start: 2018-06-01 — End: 2018-06-02

## 2018-06-02 MED ORDER — FENTANYL CITRATE (PF) 2500 MCG/50ML IJ SOLN
50.00 | INTRAMUSCULAR | Status: DC
Start: ? — End: 2018-06-02

## 2018-06-02 MED ORDER — DOCUSATE SODIUM 100 MG PO CAPS
100.00 | ORAL_CAPSULE | ORAL | Status: DC
Start: ? — End: 2018-06-02

## 2018-06-02 MED ORDER — GENERIC EXTERNAL MEDICATION
.08 | Status: DC
Start: ? — End: 2018-06-02

## 2018-06-02 MED ORDER — GABAPENTIN 300 MG PO CAPS
300.00 | ORAL_CAPSULE | ORAL | Status: DC
Start: 2018-06-01 — End: 2018-06-02

## 2018-06-02 MED ORDER — OXYBUTYNIN CHLORIDE 5 MG PO TABS
5.00 | ORAL_TABLET | ORAL | Status: DC
Start: 2018-06-01 — End: 2018-06-02

## 2018-06-02 MED ORDER — MIDODRINE HCL 5 MG PO TABS
5.00 | ORAL_TABLET | ORAL | Status: DC
Start: 2018-06-02 — End: 2018-06-02

## 2018-06-02 MED ORDER — CLONAZEPAM 0.5 MG PO TABS
0.50 | ORAL_TABLET | ORAL | Status: DC
Start: 2018-06-01 — End: 2018-06-02

## 2018-06-02 MED ORDER — ALBUTEROL SULFATE (2.5 MG/3ML) 0.083% IN NEBU
2.50 | INHALATION_SOLUTION | RESPIRATORY_TRACT | Status: DC
Start: ? — End: 2018-06-02

## 2018-06-02 MED ORDER — CLOTRIMAZOLE 1 % EX CREA
TOPICAL_CREAM | CUTANEOUS | Status: DC
Start: 2018-06-01 — End: 2018-06-02

## 2018-06-02 MED ORDER — WARFARIN SODIUM 2.5 MG PO TABS
7.50 | ORAL_TABLET | ORAL | Status: DC
Start: 2018-06-02 — End: 2018-06-02

## 2018-06-02 MED ORDER — FENTANYL CITRATE (PF) 2500 MCG/50ML IJ SOLN
100.00 | INTRAMUSCULAR | Status: DC
Start: ? — End: 2018-06-02

## 2018-06-02 MED ORDER — HYDROCORTISONE NA SUCCINATE PF 100 MG IJ SOLR
100.00 | INTRAMUSCULAR | Status: DC
Start: 2018-06-01 — End: 2018-06-02

## 2018-06-02 MED ORDER — HEPARIN LOCK FLUSH 10 UNIT/ML IV SOLN
1.00 | INTRAVENOUS | Status: DC
Start: ? — End: 2018-06-02

## 2018-06-02 MED ORDER — ACETAMINOPHEN 160 MG/5ML PO SUSP
650.00 | ORAL | Status: DC
Start: ? — End: 2018-06-02

## 2018-06-02 MED ORDER — GENERIC EXTERNAL MEDICATION
2.00 | Status: DC
Start: 2018-06-02 — End: 2018-06-02

## 2018-06-02 MED ORDER — GENERIC EXTERNAL MEDICATION
20.00 | Status: DC
Start: ? — End: 2018-06-02

## 2018-06-02 MED ORDER — PAROXETINE HCL 20 MG PO TABS
20.00 | ORAL_TABLET | ORAL | Status: DC
Start: 2018-06-02 — End: 2018-06-02

## 2018-06-02 MED ORDER — FENTANYL CITRATE (PF) 2500 MCG/50ML IJ SOLN
25.00 | INTRAMUSCULAR | Status: DC
Start: ? — End: 2018-06-02

## 2018-06-02 MED ORDER — ACETAMINOPHEN 650 MG RE SUPP
650.00 | RECTAL | Status: DC
Start: ? — End: 2018-06-02

## 2018-06-02 MED ORDER — TROPICAL LIQUID NUTRITION PO LIQD
5.00 | ORAL | Status: DC
Start: 2018-06-02 — End: 2018-06-02

## 2018-06-02 MED ORDER — GENERIC EXTERNAL MEDICATION
0.04 | Status: DC
Start: ? — End: 2018-06-02

## 2018-06-02 MED ORDER — IPRATROPIUM-ALBUTEROL 0.5-2.5 (3) MG/3ML IN SOLN
3.00 | RESPIRATORY_TRACT | Status: DC
Start: 2018-06-01 — End: 2018-06-02

## 2018-06-02 MED ORDER — LINEZOLID 600 MG/300ML IV SOLN
600.00 | INTRAVENOUS | Status: DC
Start: 2018-06-01 — End: 2018-06-02

## 2018-06-02 MED ORDER — ACETAMINOPHEN 325 MG PO TABS
650.00 | ORAL_TABLET | ORAL | Status: DC
Start: ? — End: 2018-06-02
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
## 2018-06-03 ENCOUNTER — Other Ambulatory Visit (HOSPITAL_COMMUNITY): Payer: Medicare Other

## 2018-06-03 DIAGNOSIS — J9621 Acute and chronic respiratory failure with hypoxia: Secondary | ICD-10-CM | POA: Diagnosis not present

## 2018-06-03 DIAGNOSIS — A419 Sepsis, unspecified organism: Secondary | ICD-10-CM | POA: Diagnosis not present

## 2018-06-03 DIAGNOSIS — G8250 Quadriplegia, unspecified: Secondary | ICD-10-CM | POA: Diagnosis not present

## 2018-06-03 DIAGNOSIS — J690 Pneumonitis due to inhalation of food and vomit: Secondary | ICD-10-CM | POA: Diagnosis not present

## 2018-06-03 LAB — CBC
HCT: 31.9 % — ABNORMAL LOW (ref 36.0–46.0)
HEMOGLOBIN: 9.9 g/dL — AB (ref 12.0–15.0)
MCH: 28.1 pg (ref 26.0–34.0)
MCHC: 31 g/dL (ref 30.0–36.0)
MCV: 90.6 fL (ref 80.0–100.0)
Platelets: 296 10*3/uL (ref 150–400)
RBC: 3.52 MIL/uL — ABNORMAL LOW (ref 3.87–5.11)
RDW: 17.1 % — ABNORMAL HIGH (ref 11.5–15.5)
WBC: 9.3 10*3/uL (ref 4.0–10.5)
nRBC: 0 % (ref 0.0–0.2)

## 2018-06-03 LAB — AMMONIA: Ammonia: 24 umol/L (ref 9–35)

## 2018-06-03 NOTE — Progress Notes (Addendum)
Pulmonary Critical Care Medicine [facility] [place]   PULMONARY CRITICAL CARE SERVICE  PROGRESS NOTE  Date of Service: 06/03/2018  Melissa Yates  [id number]  DOB: June 14, 1955   DOA: 06/01/2018  Referring Physician: Carron Curie, MD  HPI: Melissa Yates is a 63 y.o. female seen for follow up of Acute on Chronic Respiratory Failure.  Patient remains on ventilator full support requiring FiO2 28%.  Medications: Reviewed on Rounds  Physical Exam:  Vitals: Pulse 71 respirations 26 BP 141/52 O2 sat 100% temp 96.8  Ventilator Settings ventilator mode AC PC IP 15 rate 22 PEEP of 5 FiO2 28%  . General: Comfortable at this time . Eyes: Grossly normal lids, irises & conjunctiva . ENT: grossly tongue is normal . Neck: no obvious mass . Cardiovascular: S1 S2 normal no gallop . Respiratory: Coarse breath sounds noted bilaterally . Abdomen: soft . Skin: no rash seen on limited exam . Musculoskeletal: not rigid . Psychiatric:unable to assess . Neurologic: no seizure no involuntary movements         Lab Data:   Basic Metabolic Panel: No results for input(s): NA, K, CL, CO2, GLUCOSE, BUN, CREATININE, CALCIUM, MG, PHOS in the last 168 hours.  ABG: Recent Labs  Lab 06/02/18 0315  PHART 7.366  PCO2ART 43.2  PO2ART 83.8  HCO3 24.1  O2SAT 95.6    Liver Function Tests: No results for input(s): AST, ALT, ALKPHOS, BILITOT, PROT, ALBUMIN in the last 168 hours. No results for input(s): LIPASE, AMYLASE in the last 168 hours. Recent Labs  Lab 06/03/18 1218  AMMONIA 24    CBC: Recent Labs  Lab 06/02/18 1045 06/03/18 0839  WBC 10.0 9.3  HGB 10.2* 9.9*  HCT 32.3* 31.9*  MCV 90.5 90.6  PLT 316 296    Cardiac Enzymes: No results for input(s): CKTOTAL, CKMB, CKMBINDEX, TROPONINI in the last 168 hours.  BNP (last 3 results) No results for input(s): BNP in the last 8760 hours.  ProBNP (last 3 results) No results for input(s): PROBNP in the last 8760  hours.  Radiological Exams: Dg Abdomen Peg Tube Location  Result Date: 06/01/2018 CLINICAL DATA:  Peg tube placement EXAM: ABDOMEN - 1 VIEW COMPARISON:  None. FINDINGS: Contrast was injected through the PEG tube which is within the stomach. No extravasation. Diffuse gaseous distention of visualized bowel. IMPRESSION: Peg tube located within the stomach. Electronically Signed   By: Charlett Nose M.D.   On: 06/01/2018 21:17   Dg Chest Port 1 View  Result Date: 06/02/2018 CLINICAL DATA:  Chronic respiratory failure. Lobar pneumonia. Airway aspiration. EXAM: PORTABLE CHEST 1 VIEW COMPARISON:  03/23/2018. FINDINGS: Marked patient rotation to the right is again demonstrated. Airspace opacity throughout the majority of the right lung, most pronounced in the mid and lower lung zones, with an associated small to moderate-sized effusion. Left basilar airspace opacity. Grossly stable mildly enlarged cardiac silhouette. Tracheostomy tube in satisfactory position. Cervical spine fixation hardware. Stable moderate to marked levoconvex thoracic scoliosis. IMPRESSION: 1. Extensive right lung pneumonia with an associated small to moderate-sized effusion. 2. Left basilar pneumonia, aspiration pneumonitis or patchy atelectasis. Electronically Signed   By: Beckie Salts M.D.   On: 06/02/2018 13:11    Assessment/Plan Active Problems:   Acute on chronic respiratory failure with hypoxia (HCC)   Tracheostomy status (HCC)   Severe sepsis (HCC)   Aspiration pneumonia of both lower lobes due to gastric secretions (HCC)   Quadriplegia (HCC)   1. Acute on chronic respiratory failure with hypoxia  patient remains on full support on ventilator.  She is on pressure assist control mode currently requiring 28% FiO2. 2. Aspiration pneumonia treated continue present therapy follow-up chest x-ray as ordered. 3. Severe sepsis resolved 4. Tracheostomy continue to wean 5. Quadriplegia at baseline therapy as tolerated   I have  personally seen and evaluated the patient, evaluated laboratory and imaging results, formulated the assessment and plan and placed orders. The Patient requires high complexity decision making for assessment and support.  Case was discussed on Rounds with the Respiratory Therapy Staff  Yevonne Pax, MD [facility] Pulmonary Critical Care Medicine Sleep Medicine

## 2018-06-04 DIAGNOSIS — J690 Pneumonitis due to inhalation of food and vomit: Secondary | ICD-10-CM | POA: Diagnosis not present

## 2018-06-04 DIAGNOSIS — A419 Sepsis, unspecified organism: Secondary | ICD-10-CM | POA: Diagnosis not present

## 2018-06-04 DIAGNOSIS — G8250 Quadriplegia, unspecified: Secondary | ICD-10-CM | POA: Diagnosis not present

## 2018-06-04 DIAGNOSIS — J9621 Acute and chronic respiratory failure with hypoxia: Secondary | ICD-10-CM | POA: Diagnosis not present

## 2018-06-04 LAB — PROTIME-INR
INR: 3.43
Prothrombin Time: 34.1 seconds — ABNORMAL HIGH (ref 11.4–15.2)

## 2018-06-04 NOTE — Progress Notes (Addendum)
Pulmonary Critical Care Medicine [facility] [place]   PULMONARY CRITICAL CARE SERVICE  PROGRESS NOTE  Date of Service: 06/04/2018  [name]  ZYS:[id number]  DOB: 1955/05/11   DOA: 06/01/2018  Referring Physician: Carron Curie, MD  HPI: Melissa Yates is a 63 y.o. female seen for follow up of Acute on Chronic Respiratory Failure.  Patient remains on pressure control mode FiO2 28%.  She continues pulling tidal volumes of 400.  Medications: Reviewed on Rounds  Physical Exam:  Vitals: Pulse 73 respirations 20 BP 122/57 O2 sat 100% temp 98.9  Ventilator Settings ventilator mode AC PC rate 22 IP 22 PEEP of 5 FiO2 28%  . General: Comfortable at this time . Eyes: Grossly normal lids, irises & conjunctiva . ENT: grossly tongue is normal . Neck: no obvious mass . Cardiovascular: S1 S2 normal no gallop . Respiratory: Coarse breath sounds . Abdomen: soft . Skin: no rash seen on limited exam . Musculoskeletal: not rigid . Psychiatric:unable to assess . Neurologic: no seizure no involuntary movements         Lab Data:   Basic Metabolic Panel: No results for input(s): NA, K, CL, CO2, GLUCOSE, BUN, CREATININE, CALCIUM, MG, PHOS in the last 168 hours.  ABG: Recent Labs  Lab 06/02/18 0315  PHART 7.366  PCO2ART 43.2  PO2ART 83.8  HCO3 24.1  O2SAT 95.6    Liver Function Tests: No results for input(s): AST, ALT, ALKPHOS, BILITOT, PROT, ALBUMIN in the last 168 hours. No results for input(s): LIPASE, AMYLASE in the last 168 hours. Recent Labs  Lab 06/03/18 1218  AMMONIA 24    CBC: Recent Labs  Lab 06/02/18 1045 06/03/18 0839  WBC 10.0 9.3  HGB 10.2* 9.9*  HCT 32.3* 31.9*  MCV 90.5 90.6  PLT 316 296    Cardiac Enzymes: No results for input(s): CKTOTAL, CKMB, CKMBINDEX, TROPONINI in the last 168 hours.  BNP (last 3 results) No results for input(s): BNP in the last 8760 hours.  ProBNP (last 3 results) No results for input(s): PROBNP in the last  8760 hours.  Radiological Exams: Ct Head Wo Contrast  Result Date: 06/03/2018 CLINICAL DATA:  Altered mental status.  Hypoxia.  Cardiac arrest. EXAM: CT HEAD WITHOUT CONTRAST TECHNIQUE: Contiguous axial images were obtained from the base of the skull through the vertex without intravenous contrast. COMPARISON:  None. FINDINGS: BRAIN: No intraparenchymal hemorrhage, mass effect nor midline shift. Patchy occipital lobe hypodensities. Patchy symmetric basal ganglia hypodensities. The ventricles and sulci are normal. No acute large vascular territory infarcts. No abnormal extra-axial fluid collections. Basal cisterns are patent. VASCULAR: Mild calcific atherosclerosis carotid siphons. SKULL/SOFT TISSUES: No skull fracture. No significant soft tissue swelling. ORBITS/SINUSES: The included ocular globes and orbital contents are normal.Lobulated sphenoid and ethmoid sinus mucosal thickening. Mastoid air cells are well aerated. Soft tissue within LEFT external auditory canal most compatible with cerumen. Mastoid air cells are well aerated. OTHER: None. IMPRESSION: 1. Symmetric basal ganglia and occipital lobe hypodensities seen with posterior reversible encephalopathic syndrome (PRES), toxic or metabolic encephalopathy. Atypical appearance for hypoxic ischemic encephalopathy. Findings would be better characterized on MRI of the head as clinically indicated. 2. These results will be called to the ordering clinician or representative by the professional radiologist assistant, and communication documented in [employer] Dashboard. Electronically Signed   By: Awilda Metro M.D.   On: 06/03/2018 23:50    Assessment/Plan Active Problems:   Acute on chronic respiratory failure with hypoxia (HCC)   Tracheostomy status (HCC)  Severe sepsis (HCC)   Aspiration pneumonia of both lower lobes due to gastric secretions (HCC)   Quadriplegia (HCC)   1. Acute on chronic respiratory failure with hypoxia patient remains full  support on ventilator.  Pressures is controlled but FiO2 28%. 2. Aspiration pneumonia treated continue present therapy follow-up chest x-ray as ordered. 3. Tracheostomy remains in place ultimately the goal is to get her to baseline if she is able to 4. Severe sepsis resolved hemodynamically stable 5. Quadriplegia at baseline we will continue with supportive care   I have personally seen and evaluated the patient, evaluated laboratory and imaging results, formulated the assessment and plan and placed orders. The Patient requires high complexity decision making for assessment and support.  Case was discussed on Rounds with the Respiratory Therapy Staff  Yevonne Pax, MD [facility] Pulmonary Critical Care Medicine Sleep Medicine

## 2018-06-05 DIAGNOSIS — Z930 Tracheostomy status: Secondary | ICD-10-CM | POA: Insufficient documentation

## 2018-06-05 LAB — PROTIME-INR
INR: 2.86
Prothrombin Time: 29.6 seconds — ABNORMAL HIGH (ref 11.4–15.2)

## 2018-06-06 ENCOUNTER — Other Ambulatory Visit (HOSPITAL_COMMUNITY): Payer: Medicare Other

## 2018-06-06 DIAGNOSIS — G8250 Quadriplegia, unspecified: Secondary | ICD-10-CM | POA: Diagnosis not present

## 2018-06-06 DIAGNOSIS — J9621 Acute and chronic respiratory failure with hypoxia: Secondary | ICD-10-CM | POA: Diagnosis not present

## 2018-06-06 DIAGNOSIS — J690 Pneumonitis due to inhalation of food and vomit: Secondary | ICD-10-CM | POA: Diagnosis not present

## 2018-06-06 DIAGNOSIS — A419 Sepsis, unspecified organism: Secondary | ICD-10-CM | POA: Diagnosis not present

## 2018-06-06 LAB — CBC
HCT: 32.3 % — ABNORMAL LOW (ref 36.0–46.0)
Hemoglobin: 10.3 g/dL — ABNORMAL LOW (ref 12.0–15.0)
MCH: 28.5 pg (ref 26.0–34.0)
MCHC: 31.9 g/dL (ref 30.0–36.0)
MCV: 89.2 fL (ref 80.0–100.0)
Platelets: 290 10*3/uL (ref 150–400)
RBC: 3.62 MIL/uL — ABNORMAL LOW (ref 3.87–5.11)
RDW: 17 % — ABNORMAL HIGH (ref 11.5–15.5)
WBC: 10.1 10*3/uL (ref 4.0–10.5)
nRBC: 0.5 % — ABNORMAL HIGH (ref 0.0–0.2)

## 2018-06-06 LAB — BASIC METABOLIC PANEL
Anion gap: 7 (ref 5–15)
BUN: 20 mg/dL (ref 6–20)
CO2: 27 mmol/L (ref 22–32)
Calcium: 8.1 mg/dL — ABNORMAL LOW (ref 8.9–10.3)
Chloride: 108 mmol/L (ref 98–111)
Creatinine, Ser: 0.47 mg/dL (ref 0.44–1.00)
GFR calc non Af Amer: >60 mL/min (ref >60)
Glucose, Bld: 151 mg/dL — ABNORMAL HIGH (ref 70–99)
Potassium: 3.8 mmol/L (ref 3.5–5.1)
Sodium: 142 mmol/L (ref 135–145)

## 2018-06-06 LAB — PROTIME-INR
INR: 2.83
Prothrombin Time: 29.4 seconds — ABNORMAL HIGH (ref 11.4–15.2)

## 2018-06-06 LAB — TSH: TSH: 2.056 u[IU]/mL (ref 0.350–4.500)

## 2018-06-06 LAB — MAGNESIUM: Magnesium: 2.4 mg/dL (ref 1.7–2.4)

## 2018-06-06 NOTE — Progress Notes (Signed)
Pulmonary Critical Care Medicine [facility] [place]   PULMONARY CRITICAL CARE SERVICE  PROGRESS NOTE  Date of Service: 06/06/2018  Melissa Yates  DGU:[id number]  DOB: [birth year]   DOA: 06/01/2018  Referring Physician: Carron Curie, MD  HPI: Melissa Yates is a 63 y.o. female seen for follow up of Acute on Chronic Respiratory Failure.  Patient remains on the ventilator apparently is getting an MRI for some altered mental status.  Medications: Reviewed on Rounds  Physical Exam:  Vitals: Temperature 96.0 pulse 60 respiratory 10 blood pressure 120/89 saturations 97%  Ventilator Settings mode ventilation pressure assist control FiO2 28% tidal volume 364 PEEP 5 pressure 22  . General: Comfortable at this time . Eyes: Grossly normal lids, irises & conjunctiva . ENT: grossly tongue is normal . Neck: no obvious mass . Cardiovascular: S1 S2 normal no gallop . Respiratory: Scattered rhonchi coarse breath sounds . Abdomen: soft . Skin: no rash seen on limited exam . Musculoskeletal: not rigid . Psychiatric:unable to assess . Neurologic: no seizure no involuntary movements         Lab Data:   Basic Metabolic Panel: No results for input(s): NA, K, CL, CO2, GLUCOSE, BUN, CREATININE, CALCIUM, MG, PHOS in the last 168 hours.  ABG: Recent Labs  Lab 06/02/18 0315  PHART 7.366  PCO2ART 43.2  PO2ART 83.8  HCO3 24.1  O2SAT 95.6    Liver Function Tests: No results for input(s): AST, ALT, ALKPHOS, BILITOT, PROT, ALBUMIN in the last 168 hours. No results for input(s): LIPASE, AMYLASE in the last 168 hours. Recent Labs  Lab 06/03/18 1218  AMMONIA 24    CBC: Recent Labs  Lab 06/02/18 1045 06/03/18 0839  WBC 10.0 9.3  HGB 10.2* 9.9*  HCT 32.3* 31.9*  MCV 90.5 90.6  PLT 316 296    Cardiac Enzymes: No results for input(s): CKTOTAL, CKMB, CKMBINDEX, TROPONINI in the last 168 hours.  BNP (last 3 results) No results for input(s): BNP in the last 8760  hours.  ProBNP (last 3 results) No results for input(s): PROBNP in the last 8760 hours.  Radiological Exams: No results found.  Assessment/Plan Active Problems:   Acute on chronic respiratory failure with hypoxia (HCC)   Tracheostomy status (HCC)   Severe sepsis (HCC)   Aspiration pneumonia of both lower lobes due to gastric secretions (HCC)   Quadriplegia (HCC)   1. Acute on chronic respiratory failure with hypoxia we will continue with full support on pressure assist control titrate oxygen continue secretion management pulmonary toilet 2. Tracheostomy remains in place chronic 3. Severe sepsis resolved 4. Aspiration pneumonia treated we will continue with supportive care follow radiologically 5. 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 The Patient requires high complexity decision making for assessment and support.  Case was discussed on Rounds with the Respiratory Therapy Staff  Yevonne Pax, MD [facility] Pulmonary Critical Care Medicine Sleep Medicine
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
## 2018-06-07 ENCOUNTER — Other Ambulatory Visit (HOSPITAL_COMMUNITY): Payer: Medicare Other

## 2018-06-07 ENCOUNTER — Encounter (HOSPITAL_COMMUNITY): Payer: Self-pay | Admitting: Interventional Radiology

## 2018-06-07 DIAGNOSIS — J690 Pneumonitis due to inhalation of food and vomit: Secondary | ICD-10-CM | POA: Diagnosis not present

## 2018-06-07 DIAGNOSIS — J9621 Acute and chronic respiratory failure with hypoxia: Secondary | ICD-10-CM | POA: Diagnosis not present

## 2018-06-07 DIAGNOSIS — G8250 Quadriplegia, unspecified: Secondary | ICD-10-CM | POA: Diagnosis not present

## 2018-06-07 DIAGNOSIS — A419 Sepsis, unspecified organism: Secondary | ICD-10-CM | POA: Diagnosis not present

## 2018-06-07 HISTORY — PX: IR CATHETER TUBE CHANGE: IMG717

## 2018-06-07 LAB — PROTIME-INR
INR: 2.56
Prothrombin Time: 27.1 seconds — ABNORMAL HIGH (ref 11.4–15.2)

## 2018-06-07 MED ORDER — IOPAMIDOL (ISOVUE-300) INJECTION 61%
INTRAVENOUS | Status: AC
  Filled 2018-06-07: qty 50
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
## 2018-06-07 NOTE — Procedures (Signed)
22 Fr Foley suprapubic catheter exchange Tip is in bladder EBL 0 Comp 0

## 2018-06-08 DIAGNOSIS — G8250 Quadriplegia, unspecified: Secondary | ICD-10-CM | POA: Diagnosis not present

## 2018-06-08 DIAGNOSIS — J9621 Acute and chronic respiratory failure with hypoxia: Secondary | ICD-10-CM | POA: Diagnosis not present

## 2018-06-08 DIAGNOSIS — A419 Sepsis, unspecified organism: Secondary | ICD-10-CM | POA: Diagnosis not present

## 2018-06-08 DIAGNOSIS — J690 Pneumonitis due to inhalation of food and vomit: Secondary | ICD-10-CM | POA: Diagnosis not present

## 2018-06-08 LAB — PROTIME-INR
INR: 2.91
Prothrombin Time: 29.9 seconds — ABNORMAL HIGH (ref 11.4–15.2)
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
## 2018-06-09 DIAGNOSIS — A419 Sepsis, unspecified organism: Secondary | ICD-10-CM | POA: Diagnosis not present

## 2018-06-09 DIAGNOSIS — J690 Pneumonitis due to inhalation of food and vomit: Secondary | ICD-10-CM | POA: Diagnosis not present

## 2018-06-09 DIAGNOSIS — J9621 Acute and chronic respiratory failure with hypoxia: Secondary | ICD-10-CM | POA: Diagnosis not present

## 2018-06-09 DIAGNOSIS — G8250 Quadriplegia, unspecified: Secondary | ICD-10-CM | POA: Diagnosis not present

## 2018-06-09 LAB — PROTIME-INR
INR: 2.69
Prothrombin Time: 28.2 seconds — ABNORMAL HIGH (ref 11.4–15.2)

## 2018-06-09 NOTE — Progress Notes (Signed)
[facility]   PULMONARY CRITICAL CARE SERVICE  PROGRESS NOTE  Date of Service: 06/09/2018  Melissa MansDonna H Houchins  ZOX:[id number]  DOB: Oct 22, 1955   DOA: 06/01/2018  Referring Physician: Carron CurieAli Hijazi, MD  HPI: Melissa Yates is a 63 y.o. female seen for follow up of Acute on Chronic Respiratory Failure.  Today she was started on her wean and so far looks like she is tolerating it well.  Currently is on pressure support mode.  Family was present in the room and they were updated  Medications: Reviewed on Rounds  Physical Exam:  Vitals: Temperature 96.9 pulse 60 respiratory 19 blood pressure 140/78 saturations 97%  Ventilator Settings mode ventilation pressure support FiO2 28% tidal volume 344 pressure support 12 PEEP 5  . General: Comfortable at this time . Eyes: Grossly normal lids, irises & conjunctiva . ENT: grossly tongue is normal . Neck: no obvious mass . Cardiovascular: S1 S2 normal no gallop . Respiratory: No rhonchi or rales are noted at this time . Abdomen: soft . Skin: no rash seen on limited exam . Musculoskeletal: not rigid . Psychiatric:unable to assess . Neurologic: no seizure no involuntary movements         Lab Data:   Basic Metabolic Panel: Recent Labs  Lab 06/06/18 1534  NA 142  K 3.8  CL 108  CO2 27  GLUCOSE 151*  BUN 20  CREATININE 0.47  CALCIUM 8.1*  MG 2.4    ABG: No results for input(s): PHART, PCO2ART, PO2ART, HCO3, O2SAT in the last 168 hours.  Liver Function Tests: No results for input(s): AST, ALT, ALKPHOS, BILITOT, PROT, ALBUMIN in the last 168 hours. No results for input(s): LIPASE, AMYLASE in the last 168 hours. Recent Labs  Lab 06/03/18 1218  AMMONIA 24    CBC: Recent Labs  Lab 06/03/18 0839 06/06/18 1534  WBC 9.3 10.1  HGB 9.9* 10.3*  HCT 31.9* 32.3*  MCV 90.6 89.2  PLT 296 290    Cardiac Enzymes: No results for input(s): CKTOTAL, CKMB, CKMBINDEX, TROPONINI in the  last 168 hours.  BNP (last 3 results) No results for input(s): BNP in the last 8760 hours.  ProBNP (last 3 results) No results for input(s): PROBNP in the last 8760 hours.  Radiological Exams: Ir Catheter Tube Change  Result Date: 06/07/2018 INDICATION: Suprapubic catheter exchange EXAM: SUPRAPUBIC CATHETER EXCHANGE MEDICATIONS: The patient is currently admitted to the hospital and receiving intravenous antibiotics. The antibiotics were administered within an appropriate time frame prior to the initiation of the procedure. ANESTHESIA/SEDATION: None COMPLICATIONS: None immediate. PROCEDURE: Informed written consent was obtained from the patient after a thorough discussion of the procedural risks, benefits and alternatives. All questions were addressed. Maximal Sterile Barrier Technique was utilized including caps, mask, sterile gowns, sterile gloves, sterile drape, hand hygiene and skin antiseptic. A timeout was performed prior to the initiation of the procedure. Contrast was injected into the existing suprapubic catheter. This demonstrates that the tip at pass through the urethra and was positioned in the vagina. This was then removed over a Glidewire. A Kumpe catheter was advanced over the glidewire. The Kumpe was then retracted into the bladder. Contrast was injected opacifying the bladder. It was removed over a short Amplatz wire. A 22 French Foley catheter was then advanced over the wire into the bladder. The balloon was insufflated with 10 cc saline. Contrast was injected opacifying the bladder. FINDINGS: Final image demonstrates a 1022 French Foley suprapubic catheter with its  tip in the bladder. Contrast fills the bladder. IMPRESSION: Successful 22 French suprapubic Foley catheter exchange. The existing catheter was positioned in the vagina and therefore resulted in leakage around the tube. The new tube is positioned appropriately in the bladder. Electronically Signed   By: Jolaine Click M.D.   On:  06/07/2018 16:47    Assessment/Plan Active Problems:   Acute on chronic respiratory failure with hypoxia (HCC)   Tracheostomy status (HCC)   Severe sepsis (HCC)   Aspiration pneumonia of both lower lobes due to gastric secretions (HCC)   Quadriplegia (HCC)   1. Acute on chronic respiratory failure with hypoxia we will continue with wean protocol so far she is tolerating it well ultimately the goal is to try to get her back to T collar 2. Tracheostomy permanent remains in place 3. Severe sepsis dynamics are improved we will continue supportive care 4. Aspiration pneumonia we will continue present therapy supportive care 5. Quadriplegia unchanged   I have personally seen and evaluated the patient, evaluated laboratory and imaging results, formulated the assessment and plan and placed orders. The Patient requires high complexity decision making for assessment and support.  Case was discussed on Rounds with the Respiratory Therapy Staff  Yevonne Pax, MD [facility] Pulmonary Critical Care Medicine Sleep Medicine

## 2018-06-09 NOTE — Progress Notes (Signed)
Referring Physician(s): [place]  Supervising Physician: Corrie Mckusick  Patient Status:  [facility] - In-pt  Chief Complaint: Neurogenic bladder  Subjective: Continues with leakage around SP tube insertion.  IR consulted for re-eval.   Patient in bed, contracted, non-communicative.  Family at bedside states tube leaks chronically.  Allergies: Patient has no allergy information on record.  Medications: Prior to Admission medications   Not on File     Vital Signs: There were no vitals taken for this visit.  Physical Exam  NAD, trach/vent Skin: SP catheter in place. Deep stoma.  Urine leaking around tube. Very small amount of urine collected in foley bag.   Imaging: Mr Brain Wo Contrast  Result Date: 06/06/2018 CLINICAL DATA:  63 year old female status post a cardiac arrest with persistently altered mental status, abnormal noncontrast head CT 3 days ago. EXAM: MRI HEAD WITHOUT CONTRAST TECHNIQUE: Multiplanar, multiecho pulse sequences of the brain and surrounding structures were obtained without intravenous contrast. COMPARISON:  Head CT 06/03/2018. FINDINGS: Brain: Confluent abnormal T2 and FLAIR hyperintensity with some restricted diffusion in the bilateral basal ganglia. Similar abnormal T2 and FLAIR hyperintensity with more subtle diffusion abnormality in both occipital poles (series 15, image 15). These correspond to the areas of abnormal hypodensity by CT. There does appear to be a small additional cortical area of involvement at the right superior motor strip on series 15, image 23. No other cortical or white matter involvement. But there is also symmetric abnormal diffusion, T2 and FLAIR in the midbrain (series 5, image 72). The thalami are spared. The brainstem is spared. However, there is questionable generalized abnormal cerebellar T2 signal and diffusion (series 10, image 7). There is intrinsic T1 hyperintensity in the basal ganglia and occipital lobes which is probably  laminar necrosis, as SWI is motion degraded but without convincing evidence of blood products. No associated mass effect. No midline shift or ventriculomegaly. Patent basilar cisterns. No extra-axial collection. Negative pituitary and cervicomedullary junction. Vascular: Major intracranial vascular flow voids are preserved. Skull and upper cervical spine: Partially visible postoperative changes to the cervical spine. Visualized bone marrow signal is within normal limits. Sinuses/Orbits: Negative orbits. Mild paranasal sinus mucosal thickening. Other: Trace mastoid fluid. Scalp and face soft tissues appear negative. IMPRESSION: 1. Symmetric abnormal signal in the bilateral basal ganglia, midbrain, occipital poles, and probably also the cerebellum. 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 Minimal if any additional cortical signal abnormality at the right superior motor strip. 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 2. No associated mass effect or strong evidence of intracranial hemorrhage. Electronically Signed   By: Genevie Ann M.D.   On: 06/06/2018 20:26   Ir Catheter Tube Change  Result Date: 06/07/2018 INDICATION: Suprapubic catheter exchange EXAM: SUPRAPUBIC CATHETER EXCHANGE MEDICATIONS: The patient is currently admitted to the hospital and receiving intravenous antibiotics. The antibiotics were administered within an appropriate time frame prior to the initiation of the procedure. ANESTHESIA/SEDATION: None COMPLICATIONS: None immediate. PROCEDURE: Informed written consent was obtained from the patient after a thorough discussion of the procedural risks, benefits and alternatives. All questions were addressed. Maximal Sterile Barrier Technique was utilized including caps, mask, sterile gowns, sterile gloves, sterile drape, hand hygiene and skin antiseptic. A timeout was performed prior to the initiation of the procedure. Contrast was  injected into the existing suprapubic catheter. This demonstrates that the tip at pass through the urethra and was positioned in the vagina. This was then removed over a  Glidewire. A Kumpe catheter was advanced over the glidewire. The Kumpe was then retracted into the bladder. Contrast was injected opacifying the bladder. It was removed over a short Amplatz wire. A 22 French Foley catheter was then advanced over the wire into the bladder. The balloon was insufflated with 10 cc saline. Contrast was injected opacifying the bladder. FINDINGS: Final image demonstrates a 72 French Foley suprapubic catheter with its tip in the bladder. Contrast fills the bladder. IMPRESSION: Successful 22 French suprapubic Foley catheter exchange. The existing catheter was positioned in the vagina and therefore resulted in leakage around the tube. The new tube is positioned appropriately in the bladder. Electronically Signed   By: Marybelle Killings M.D.   On: 06/07/2018 16:47    Labs:  CBC: Recent Labs    03/26/18 0506 06/02/18 1045 06/03/18 0839 06/06/18 1534  WBC 8.2 10.0 9.3 10.1  HGB 12.1 10.2* 9.9* 10.3*  HCT 40.5 32.3* 31.9* 32.3*  PLT 266 316 296 290    COAGS: Recent Labs    06/06/18 0500 06/07/18 0631 06/08/18 0606 06/09/18 0523  INR 2.83 2.56 2.91 2.69    BMP: Recent Labs    03/22/18 1159 03/23/18 0608 03/24/18 0624 03/25/18 0724 06/06/18 1534  NA 153* 149* 146* 142 142  K 3.2*  --  3.0* 3.8 3.8  CL 104  --  98 97* 108  CO2 41*  --  39* 36* 27  GLUCOSE 157*  --  150* 142* 151*  BUN 51*  --  46* 39* 20  CALCIUM 8.9  --  8.8* 8.5* 8.1*  CREATININE 0.86  --  0.70 0.65 0.47  GFRNONAA >60  --  >60 >60 >60  GFRAA >60  --  >60 >60 >60    LIVER FUNCTION TESTS: No results for input(s): BILITOT, AST, ALT, ALKPHOS, PROT, ALBUMIN in the last 8760 hours.  Assessment and Plan: Suprapubic catheter last exchanged in IR 2/5 Discussed with Dr. Earleen Newport who has reviewed available imaging.  Balloon has  likely floated away from skin. Given chronic leakage without significant improvement since 22Fr exchange I have asked the RN to pull back on tube until resistance met to ensure balloon is positioned as correctly as possible to see if this adjustment will alleviate some of the leakage.  This may need to be done more frequently if balloon migrates easily.   Please reconsult if needed.   Electronically Signed: Docia Barrier, PA 06/09/2018, 3:58 PM   I spent a total of 15 Minutes at the the patient's bedside AND on the patient's hospital floor or unit, greater than 50% of which was counseling/coordinating care for neurogenic bladder.

## 2018-06-10 ENCOUNTER — Other Ambulatory Visit (HOSPITAL_COMMUNITY): Payer: Medicare Other

## 2018-06-10 DIAGNOSIS — G8250 Quadriplegia, unspecified: Secondary | ICD-10-CM | POA: Diagnosis not present

## 2018-06-10 DIAGNOSIS — A419 Sepsis, unspecified organism: Secondary | ICD-10-CM | POA: Diagnosis not present

## 2018-06-10 DIAGNOSIS — J690 Pneumonitis due to inhalation of food and vomit: Secondary | ICD-10-CM | POA: Diagnosis not present

## 2018-06-10 DIAGNOSIS — J9621 Acute and chronic respiratory failure with hypoxia: Secondary | ICD-10-CM | POA: Diagnosis not present

## 2018-06-10 LAB — PROTIME-INR
INR: 2.06
Prothrombin Time: 22.9 seconds — ABNORMAL HIGH (ref 11.4–15.2)

## 2018-06-10 NOTE — Progress Notes (Addendum)
Pulmonary Critical Care Medicine [facility] [place]   PULMONARY CRITICAL CARE SERVICE  PROGRESS NOTE  Date of Service: 06/10/2018  Melissa Yates  TMH:[id number]  DOB: 10-10-1955   DOA: 06/01/2018  Referring Physician: Carron Curie, MD  HPI: Melissa Yates is a 63 y.o. female seen for follow up of Acute on Chronic Respiratory Failure.  Patient was able to tolerate 6 hours on pressure support yesterday.  She is on pressure support again today with a goal of 12 hours.  She is been tidal volumes of 300 and appears to be doing well.  Minimal secretions noted.  Medications: Reviewed on Rounds  Physical Exam:  Vitals: Pulse 96 respirations 16 BP 165/66 O2 sat 99% temp 96.8  Ventilator Settings ventilator mode pressure support FiO2 20% tidal volume 300+ pressure support 12 PEEP 5  . General: Comfortable at this time . Eyes: Grossly normal lids, irises & conjunctiva . ENT: grossly tongue is normal . Neck: no obvious mass . Cardiovascular: S1 S2 normal no gallop . Respiratory: No rhonchi rales noted . Abdomen: soft . Skin: no rash seen on limited exam . Musculoskeletal: not rigid . Psychiatric:unable to assess . Neurologic: no seizure no involuntary movements         Lab Data:   Basic Metabolic Panel: Recent Labs  Lab 06/06/18 1534  NA 142  K 3.8  CL 108  CO2 27  GLUCOSE 151*  BUN 20  CREATININE 0.47  CALCIUM 8.1*  MG 2.4    ABG: No results for input(s): PHART, PCO2ART, PO2ART, HCO3, O2SAT in the last 168 hours.  Liver Function Tests: No results for input(s): AST, ALT, ALKPHOS, BILITOT, PROT, ALBUMIN in the last 168 hours. No results for input(s): LIPASE, AMYLASE in the last 168 hours. No results for input(s): AMMONIA in the last 168 hours.  CBC: Recent Labs  Lab 06/06/18 1534  WBC 10.1  HGB 10.3*  HCT 32.3*  MCV 89.2  PLT 290    Cardiac Enzymes: No results for input(s): CKTOTAL, CKMB, CKMBINDEX, TROPONINI in the last 168 hours.  BNP  (last 3 results) No results for input(s): BNP in the last 8760 hours.  ProBNP (last 3 results) No results for input(s): PROBNP in the last 8760 hours.  Radiological Exams: [place] Rt Lower Extrem Ltd Soft Tissue Non Vascular  Result Date: 06/10/2018 CLINICAL DATA:  Patient with right leg swelling above the knee. EXAM: ULTRASOUND RIGHT LOWER EXTREMITY LIMITED TECHNIQUE: Ultrasound examination of the lower extremity soft tissues was performed in the area of clinical concern. COMPARISON:  None. FINDINGS: Extensive edema is demonstrated throughout the thigh. At the site of clinical concern there is an 8.1 x 3.2 x 6.8 cm mixed echogenicity solid and cystic mass. IMPRESSION: Large mixed echogenicity solid and cystic mass at the site of concern within the right thigh concerning for the possibility of abscess. Extensive edema throughout the visualized soft tissues. These results will be called to the ordering clinician or representative by the Radiologist Assistant, and communication documented in the PACS or zVision Dashboard. Electronically Signed   By: Annia Belt M.D.   On: 06/10/2018 17:35    Assessment/Plan Active Problems:   Acute on chronic respiratory failure with hypoxia (HCC)   Tracheostomy status (HCC)   Severe sepsis (HCC)   Aspiration pneumonia of both lower lobes due to gastric secretions (HCC)   Quadriplegia (HCC)   1. Acute on chronic respiratory failure with hypoxia continue with weaning protocol.  She appears to be tolerating well  goal is to get her back to trach collar as soon as possible. 2. Tracheostomy remains in place 3. Severe sepsis dynamics are improved continue supportive care 4. Aspiration pneumonia continue present therapy and supportive care 5. Quadriplegia untouched   I have personally seen and evaluated the patient, evaluated laboratory and imaging results, formulated the assessment and plan and placed orders. The Patient requires high complexity decision making for  assessment and support.  Case was discussed on Rounds with the Respiratory Therapy Staff  Yevonne Pax, MD [facility] Pulmonary Critical Care Medicine Sleep Medicine

## 2018-06-11 DIAGNOSIS — J690 Pneumonitis due to inhalation of food and vomit: Secondary | ICD-10-CM | POA: Diagnosis not present

## 2018-06-11 DIAGNOSIS — A419 Sepsis, unspecified organism: Secondary | ICD-10-CM | POA: Diagnosis not present

## 2018-06-11 DIAGNOSIS — J9621 Acute and chronic respiratory failure with hypoxia: Secondary | ICD-10-CM | POA: Diagnosis not present

## 2018-06-11 DIAGNOSIS — G8250 Quadriplegia, unspecified: Secondary | ICD-10-CM | POA: Diagnosis not present

## 2018-06-11 LAB — PROTIME-INR
INR: 1.62
PROTHROMBIN TIME: 19 s — AB (ref 11.4–15.2)

## 2018-06-11 NOTE — Progress Notes (Addendum)
[facility]   PULMONARY CRITICAL CARE SERVICE  PROGRESS NOTE  Date of Service: 06/11/2018  Melissa MansDonna H Kennard  ZOX:[id number]  DOB: [birth year]   DOA: 06/01/2018  Referring Physician: Carron CurieAli Hijazi, MD  HPI: Melissa Yates is a 63 y.o. female seen for follow up of Acute on Chronic Respiratory Failure.  Patient's vent settings decreased today.  Goal on pressure support is 16 hours today.  She tolerated 12 hours yesterday without difficulty.  Minimal secretions are noted.  Medications: Reviewed on Rounds  Physical Exam:  Vitals: Pulse 54 respirations 20 BP 145/74 O2 sat 98% temp 97.3  Ventilator Settings ventilator mode AC PC rate of 15 IP 20 FiO2 28% PEEP of 5  . General: Comfortable at this time . Eyes: Grossly normal lids, irises & conjunctiva . ENT: grossly tongue is normal . Neck: no obvious mass . Cardiovascular: S1 S2 normal no gallop . Respiratory: No rales or rhonchi noted . Abdomen: soft . Skin: no rash seen on limited exam . Musculoskeletal: not rigid . Psychiatric:unable to assess . Neurologic: no seizure no involuntary movements         Lab Data:   Basic Metabolic Panel: Recent Labs  Lab 06/06/18 1534  NA 142  K 3.8  CL 108  CO2 27  GLUCOSE 151*  BUN 20  CREATININE 0.47  CALCIUM 8.1*  MG 2.4    ABG: No results for input(s): PHART, PCO2ART, PO2ART, HCO3, O2SAT in the last 168 hours.  Liver Function Tests: No results for input(s): AST, ALT, ALKPHOS, BILITOT, PROT, ALBUMIN in the last 168 hours. No results for input(s): LIPASE, AMYLASE in the last 168 hours. No results for input(s): AMMONIA in the last 168 hours.  CBC: Recent Labs  Lab 06/06/18 1534  WBC 10.1  HGB 10.3*  HCT 32.3*  MCV 89.2  PLT 290    Cardiac Enzymes: No results for input(s): CKTOTAL, CKMB, CKMBINDEX, TROPONINI in the last 168 hours.  BNP (last 3 results) No results for input(s): BNP in the last 8760 hours.  ProBNP  (last 3 results) No results for input(s): PROBNP in the last 8760 hours.  Radiological Exams: [place] Rt Lower Extrem Ltd Soft Tissue Non Vascular  Result Date: 06/10/2018 CLINICAL DATA:  Patient with right leg swelling above the knee. EXAM: ULTRASOUND RIGHT LOWER EXTREMITY LIMITED TECHNIQUE: Ultrasound examination of the lower extremity soft tissues was performed in the area of clinical concern. COMPARISON:  None. FINDINGS: Extensive edema is demonstrated throughout the thigh. At the site of clinical concern there is an 8.1 x 3.2 x 6.8 cm mixed echogenicity solid and cystic mass. IMPRESSION: Large mixed echogenicity solid and cystic mass at the site of concern within the right thigh concerning for the possibility of abscess. Extensive edema throughout the visualized soft tissues. These results will be called to the ordering clinician or representative by the Radiologist Assistant, and communication documented in the PACS or zVision Dashboard. Electronically Signed   By: Annia Beltrew  Davis M.D.   On: 06/10/2018 17:35    Assessment/Plan Active Problems:   Acute on chronic respiratory failure with hypoxia (HCC)   Tracheostomy status (HCC)   Severe sepsis (HCC)   Aspiration pneumonia of both lower lobes due to gastric secretions (HCC)   Quadriplegia (HCC)   1. Acute on chronic respiratory failure with hypoxia continue with weaning per protocol.  Goal is to get patient back to trach collar soon as possible. 2. Tracheostomy remains in place 3. Severe sepsis  hemodynamically improved continue supportive care 4. Aspiration pneumonia continue present therapy and supportive care 5. Quadriplegia unchanged.   I have personally seen and evaluated the patient, evaluated laboratory and imaging results, formulated the assessment and plan and placed orders. The Patient requires high complexity decision making for assessment and support.  Case was discussed on Rounds with the Respiratory Therapy Staff  Yevonne PaxSaadat A Khan,  MD [facility] Pul[facility] Critical [facility] Medicine Sleep Medicine

## 2018-06-12 ENCOUNTER — Other Ambulatory Visit (HOSPITAL_COMMUNITY): Payer: Medicare Other

## 2018-06-12 DIAGNOSIS — A419 Sepsis, unspecified organism: Secondary | ICD-10-CM | POA: Diagnosis not present

## 2018-06-12 DIAGNOSIS — G8250 Quadriplegia, unspecified: Secondary | ICD-10-CM | POA: Diagnosis not present

## 2018-06-12 DIAGNOSIS — J690 Pneumonitis due to inhalation of food and vomit: Secondary | ICD-10-CM | POA: Diagnosis not present

## 2018-06-12 DIAGNOSIS — J9621 Acute and chronic respiratory failure with hypoxia: Secondary | ICD-10-CM | POA: Diagnosis not present

## 2018-06-12 LAB — PROTIME-INR
INR: 1.55
Prothrombin Time: 18.4 seconds — ABNORMAL HIGH (ref 11.4–15.2)
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
## 2018-06-12 NOTE — Progress Notes (Signed)
Pulmonary Critical Care Medicine [facility] [place]   PULMONARY CRITICAL CARE SERVICE  PROGRESS NOTE  Date of Service: 06/12/2018  CATLIN MCKIVER  OMB:[id number]  DOB: 11-Jun-1955   DOA: 06/01/2018  Referring Physician: Carron Curie, MD  HPI: EBELYN LESNER is a 63 y.o. female seen for follow up of Acute on Chronic Respiratory Failure.  Patient currently is on T collar has been on 28% FiO2 good saturations are noted today the trial is for 2 hours  Medications: Reviewed on Rounds  Physical Exam:  Vitals: Temperature 96.9 pulse 57 respiratory 18 blood pressure 132/68 saturation 95%  Ventilator Settings on T collar FiO2 28%  . General: Comfortable at this time . Eyes: Grossly normal lids, irises & conjunctiva . ENT: grossly tongue is normal . Neck: no obvious mass . Cardiovascular: S1 S2 normal no gallop . Respiratory: No rhonchi or rales are noted at this time . Abdomen: soft . Skin: no rash seen on limited exam . Musculoskeletal: not rigid . Psychiatric:unable to assess . Neurologic: no seizure no involuntary movements         Lab Data:   Basic Metabolic Panel: Recent Labs  Lab 06/06/18 1534  NA 142  K 3.8  CL 108  CO2 27  GLUCOSE 151*  BUN 20  CREATININE 0.47  CALCIUM 8.1*  MG 2.4    ABG: No results for input(s): PHART, PCO2ART, PO2ART, HCO3, O2SAT in the last 168 hours.  Liver Function Tests: No results for input(s): AST, ALT, ALKPHOS, BILITOT, PROT, ALBUMIN in the last 168 hours. No results for input(s): LIPASE, AMYLASE in the last 168 hours. No results for input(s): AMMONIA in the last 168 hours.  CBC: Recent Labs  Lab 06/06/18 1534  WBC 10.1  HGB 10.3*  HCT 32.3*  MCV 89.2  PLT 290    Cardiac Enzymes: No results for input(s): CKTOTAL, CKMB, CKMBINDEX, TROPONINI in the last 168 hours.  BNP (last 3 results) No results for input(s): BNP in the last 8760 hours.  ProBNP (last 3 results) No results for input(s): PROBNP in the  last 8760 hours.  Radiological Exams: [place] Rt Lower Extrem Ltd Soft Tissue Non Vascular  Result Date: 06/10/2018 CLINICAL DATA:  Patient with right leg swelling above the knee. EXAM: ULTRASOUND RIGHT LOWER EXTREMITY LIMITED TECHNIQUE: Ultrasound examination of the lower extremity soft tissues was performed in the area of clinical concern. COMPARISON:  None. FINDINGS: Extensive edema is demonstrated throughout the thigh. At the site of clinical concern there is an 8.1 x 3.2 x 6.8 cm mixed echogenicity solid and cystic mass. IMPRESSION: Large mixed echogenicity solid and cystic mass at the site of concern within the right thigh concerning for the possibility of abscess. Extensive edema throughout the visualized soft tissues. These results will be called to the ordering clinician or representative by the Radiologist Assistant, and communication documented in the PACS or zVision Dashboard. Electronically Signed   By: Annia Belt M.D.   On: 06/10/2018 17:35    Assessment/Plan Active Problems:   Acute on chronic respiratory failure with hypoxia (HCC)   Tracheostomy status (HCC)   Severe sepsis (HCC)   Aspiration pneumonia of both lower lobes due to gastric secretions (HCC)   Quadriplegia (HCC)   1. Acute on chronic respiratory failure with hypoxia we will continue with the T collar titrate oxygen continue pulmonary toilet. 2. Tracheostomy remains in place continue with supportive care 3. Severe sepsis hemodynamically stable 4. Aspiration pneumonia treated we will continue to follow 5.  Quadriplegia at baseline continue present therapy   I have personally seen and evaluated the patient, evaluated laboratory and imaging results, formulated the assessment and plan and placed orders. The Patient requires high complexity decision making for assessment and support.  Case was discussed on Rounds with the Respiratory Therapy Staff  Yevonne PaxSaadat A , MD [facility] Pulmonary Critical Care Medicine Sleep  Medicine

## 2018-06-12 NOTE — Progress Notes (Signed)
Sent for pt to get abscess drain. [facility] staff states they are not ready to bring pt down to IR.

## 2018-06-13 ENCOUNTER — Other Ambulatory Visit (HOSPITAL_COMMUNITY): Payer: Medicare Other

## 2018-06-13 DIAGNOSIS — G8250 Quadriplegia, unspecified: Secondary | ICD-10-CM | POA: Diagnosis not present

## 2018-06-13 DIAGNOSIS — J9621 Acute and chronic respiratory failure with hypoxia: Secondary | ICD-10-CM | POA: Diagnosis not present

## 2018-06-13 DIAGNOSIS — A419 Sepsis, unspecified organism: Secondary | ICD-10-CM | POA: Diagnosis not present

## 2018-06-13 DIAGNOSIS — J690 Pneumonitis due to inhalation of food and vomit: Secondary | ICD-10-CM | POA: Diagnosis not present

## 2018-06-13 LAB — BASIC METABOLIC PANEL
Anion gap: 9 (ref 5–15)
BUN: 11 mg/dL (ref 6–20)
CALCIUM: 7.8 mg/dL — AB (ref 8.9–10.3)
CO2: 29 mmol/L (ref 22–32)
Chloride: 104 mmol/L (ref 98–111)
Creatinine, Ser: 0.5 mg/dL (ref 0.44–1.00)
GFR calc Af Amer: >60 mL/min (ref >60)
GFR calc non Af Amer: >60 mL/min (ref >60)
Glucose, Bld: 136 mg/dL — ABNORMAL HIGH (ref 70–99)
Potassium: 4 mmol/L (ref 3.5–5.1)
Sodium: 142 mmol/L (ref 135–145)

## 2018-06-13 LAB — CBC
HCT: 22.7 % — ABNORMAL LOW (ref 36.0–46.0)
Hemoglobin: 7 g/dL — ABNORMAL LOW (ref 12.0–15.0)
MCH: 28.1 pg (ref 26.0–34.0)
MCHC: 30.8 g/dL (ref 30.0–36.0)
MCV: 91.2 fL (ref 80.0–100.0)
Platelets: 184 10*3/uL (ref 150–400)
RBC: 2.49 MIL/uL — ABNORMAL LOW (ref 3.87–5.11)
RDW: 17.5 % — ABNORMAL HIGH (ref 11.5–15.5)
WBC: 12.3 10*3/uL — ABNORMAL HIGH (ref 4.0–10.5)
nRBC: 2.9 % — ABNORMAL HIGH (ref 0.0–0.2)

## 2018-06-13 LAB — LACTIC ACID, PLASMA: Lactic Acid, Venous: 1.7 mmol/L (ref 0.5–1.9)

## 2018-06-13 NOTE — Consult Note (Signed)
Ref: Default, Provider, MD   Subjective:  Bradycardia. Episodes during sleeping hour. Heart rate variability continuesas 30-90 bpm. Patient is septic with abscess. She is not a candidate for permanent pacemaker therapy at present time.  Objective:  Vital Signs in the last 24 hours:    Physical Exam: BP Readings from Last 1 Encounters:  03/20/18 (!) 101/51     Wt Readings from Last 1 Encounters:  No data found for Wt    Weight change:  There is no height or weight on file to calculate BMI. HEENT: [place]/AT, Eyes-Blue, Conjunctiva-Pale pink, Sclera-Non-icteric Neck: No JVD, No bruit, Trachea midline. Lungs:  Coarse, Bilateral. Cardiac:  Regular rhythm, normal S1 and S2, no S3. II/VI systolic murmur. Abdomen:  Soft, non-tender. BS present. Extremities:  2 + edema present. No cyanosis. No clubbing. Large right knee area abscess CNS: AxOx0, Cranial nerves grossly intact. Heel protectors in place.  Skin: Warm and dry.   Intake/Output from previous day: No intake/output data recorded.    Lab Results: BMET    Component Value Date/Time   NA 142 06/06/2018 1534   NA 142 03/25/2018 0724   NA 146 (H) 03/24/2018 0624   K 3.8 06/06/2018 1534   K 3.8 03/25/2018 0724   K 3.0 (L) 03/24/2018 0624   CL 108 06/06/2018 1534   CL 97 (L) 03/25/2018 0724   CL 98 03/24/2018 0624   CO2 27 06/06/2018 1534   CO2 36 (H) 03/25/2018 0724   CO2 39 (H) 03/24/2018 0624   GLUCOSE 151 (H) 06/06/2018 1534   GLUCOSE 142 (H) 03/25/2018 0724   GLUCOSE 150 (H) 03/24/2018 0624   BUN 20 06/06/2018 1534   BUN 39 (H) 03/25/2018 0724   BUN 46 (H) 03/24/2018 0624   CREATININE 0.47 06/06/2018 1534   CREATININE 0.65 03/25/2018 0724   CREATININE 0.70 03/24/2018 0624   CALCIUM 8.1 (L) 06/06/2018 1534   CALCIUM 8.5 (L) 03/25/2018 0724   CALCIUM 8.8 (L) 03/24/2018 0624   GFRNONAA >60 06/06/2018 1534   GFRNONAA >60 03/25/2018 0724   GFRNONAA >60 03/24/2018 0624   GFRAA >60 06/06/2018 1534   GFRAA >60  03/25/2018 0724   GFRAA >60 03/24/2018 0624   CBC    Component Value Date/Time   WBC 10.1 06/06/2018 1534   RBC 3.62 (L) 06/06/2018 1534   HGB 10.3 (L) 06/06/2018 1534   HCT 32.3 (L) 06/06/2018 1534   PLT 290 06/06/2018 1534   MCV 89.2 06/06/2018 1534   MCH 28.5 06/06/2018 1534   MCHC 31.9 06/06/2018 1534   RDW 17.0 (H) 06/06/2018 1534   LYMPHSABS 1.7 03/11/2018 0958   MONOABS 0.9 03/11/2018 0958   EOSABS 0.3 03/11/2018 0958   BASOSABS 0.0 03/11/2018 0958   HEPATIC Function Panel No results for input(s): PROT in the last 8760 hours.  Invalid input(s):  ALBUMIN,  AST,  ALT,  ALKPHOS,  BILIDIR,  IBILI HEMOGLOBIN A1C No components found for: HGA1C,  MPG CARDIAC ENZYMES Lab Results  Component Value Date   CKTOTAL 36 (L) 03/29/2018   CKMB 1.7 03/29/2018   TROPONINI <0.03 03/29/2018   TROPONINI <0.03 03/29/2018   TROPONINI <0.03 03/29/2018   BNP No results for input(s): PROBNP in the last 8760 hours. TSH Recent Labs    06/06/18 1534  TSH 2.056   CHOLESTEROL No results for input(s): CHOL in the last 8760 hours.  Scheduled Meds: Continuous Infusions: PRN Meds:.  Assessment/Plan: Asymptomatic sinus bradycardia Acute on chronic respiratory failure Quadriplegia Acute sepsis Large right knee  area abscess S/P aspiration pneumonia S/P cardiac arrest Toxic or metabolic encephalopathy Anemia of chronic disease  May keep external pacemaker handy if significant bradycardia occures.  Not a candidate for permanent pacemaker till sepsis or severe infections are resolved.   LOS: 0 days    Orpah Cobb  MD  06/13/2018, 1:44 PM

## 2018-06-13 NOTE — Procedures (Signed)
Interventional Radiology Procedure:   Indications: Right leg hematoma  Procedure: US guided aspiration of right leg collection  Findings: Complex collection in medial right leg, compatible with hematoma.  Only 1 ml of blood could be aspirated  Complications: None     EBL: Minimal  Plan: Send fluid for culture.     Melissa Yates R. Lowella Dandy, MD  Pager: [phone]

## 2018-06-13 NOTE — Progress Notes (Signed)
Pulmonary Critical Care Medicine [facility] [place]   PULMONARY CRITICAL CARE SERVICE  PROGRESS NOTE  Date of Service: 06/13/2018  [name]  WUJ:[id number]  DOB: [birth year]   DOA: 06/01/2018  Referring Physician: Carron Curie, MD  HPI: Melissa Yates is a 63 y.o. female seen for follow up of Acute on Chronic Respiratory Failure.  Patient is right now on T collar she is weaning the goal is for hemodialysis today  Medications: Reviewed on Rounds  Physical Exam:  Vitals: Temperature 97.0 pulse 50 respiratory 21 blood pressure 104/67 saturations 100%  Ventilator Settings patient is off the ventilator on T collar right now on 28% FiO2  . General: Comfortable at this time . Eyes: Grossly normal lids, irises & conjunctiva . ENT: grossly tongue is normal . Neck: no obvious mass . Cardiovascular: S1 S2 normal no gallop . Respiratory: Scattered rhonchi expansion is equal right now . Abdomen: soft . Skin: no rash seen on limited exam . Musculoskeletal: not rigid . Psychiatric:unable to assess . Neurologic: no seizure no involuntary movements         Lab Data:   Basic Metabolic Panel: Recent Labs  Lab 06/06/18 1534  NA 142  K 3.8  CL 108  CO2 27  GLUCOSE 151*  BUN 20  CREATININE 0.47  CALCIUM 8.1*  MG 2.4    ABG: No results for input(s): PHART, PCO2ART, PO2ART, HCO3, O2SAT in the last 168 hours.  Liver Function Tests: No results for input(s): AST, ALT, ALKPHOS, BILITOT, PROT, ALBUMIN in the last 168 hours. No results for input(s): LIPASE, AMYLASE in the last 168 hours. No results for input(s): AMMONIA in the last 168 hours.  CBC: Recent Labs  Lab 06/06/18 1534  WBC 10.1  HGB 10.3*  HCT 32.3*  MCV 89.2  PLT 290    Cardiac Enzymes: No results for input(s): CKTOTAL, CKMB, CKMBINDEX, TROPONINI in the last 168 hours.  BNP (last 3 results) No results for input(s): BNP in the last 8760 hours.  ProBNP (last 3 results) No results for  input(s): PROBNP in the last 8760 hours.  Radiological Exams: US Guided Needle Placement  Result Date: 06/13/2018 INDICATION: 63 year old with a complex collection in the right lower extremity. Concern for hematoma versus abscess. EXAM: ULTRASOUND-GUIDED ASPIRATION OF RIGHT LOWER EXTREMITY HEMATOMA MEDICATIONS: None ANESTHESIA/SEDATION: None COMPLICATIONS: None immediate. PROCEDURE: Informed consent was obtained for an ultrasound-guided aspiration. A timeout was performed prior to the initiation of the procedure. Ultrasound was used to identify a complex collection along the medial aspect of the right knee. Most hypoechoic component of the collection was targeted for aspiration. The overlying skin was prepped with chlorhexidine and sterile field was created. Using ultrasound guidance, a Yueh catheter was directed into the collection but only 1 mL of dark bloody fluid could be aspirated. Yueh catheter was directed into a different components of the collection but no additional fluid could be obtained. Bandage placed over the puncture site. FINDINGS: Heterogeneous collection in the soft tissues of the medial right knee and upper calf. Findings are compatible with a complex hematoma. 1 mL of blood was aspirated. IMPRESSION: Hematoma in the medial right leg. Only 1 mL fluid could be aspirated. Fluid was sent for culture. Electronically Signed   By: Richarda Overlie M.D.   On: 06/13/2018 14:25    Assessment/Plan Active Problems:   Acute on chronic respiratory failure with hypoxia (HCC)   Tracheostomy status (HCC)   Severe sepsis (HCC)   Aspiration pneumonia of both  lower lobes due to gastric secretions (HCC)   Quadriplegia (HCC)   1. Acute on chronic respiratory failure with hypoxia we will continue with weaning on T collar.  Patient is on goal of 8 hours 2. Tracheostomy remains in place per minute 3. Severe sepsis hemodynamically stable 4. Aspiration pneumonia treated we will continue to  follow 5. Quadriplegia at baseline   I have personally seen and evaluated the patient, evaluated laboratory and imaging results, formulated the assessment and plan and placed orders. The Patient requires high complexity decision making for assessment and support.  Case was discussed on Rounds with the Respiratory Therapy Staff  Yevonne Pax, MD [facility] Pulmonary Critical Care Medicine Sleep Medicine

## 2018-06-14 DIAGNOSIS — G8250 Quadriplegia, unspecified: Secondary | ICD-10-CM | POA: Diagnosis not present

## 2018-06-14 DIAGNOSIS — J690 Pneumonitis due to inhalation of food and vomit: Secondary | ICD-10-CM | POA: Diagnosis not present

## 2018-06-14 DIAGNOSIS — A419 Sepsis, unspecified organism: Secondary | ICD-10-CM | POA: Diagnosis not present

## 2018-06-14 DIAGNOSIS — J9621 Acute and chronic respiratory failure with hypoxia: Secondary | ICD-10-CM | POA: Diagnosis not present

## 2018-06-14 NOTE — Progress Notes (Addendum)
Pulmonary Critical Care Medicine [facility] [place]   PULMONARY CRITICAL CARE SERVICE  PROGRESS NOTE  Date of Service: 06/14/2018  Melissa Yates  NID:[id number]  DOB: 11-Nov-1955   DOA: 06/01/2018  Referring Physician: Carron Curie, MD  HPI: Melissa Yates is a 63 y.o. female seen for follow up of Acute on Chronic Respiratory Failure.  Patient is on T collar 28% FiO2 time.  Goal for today is 12 hours which patient is doing at this time.  Currently doing well no acute distress is noted.  Medications: Reviewed on Rounds  Physical Exam:  Vitals: Pulse 62 respiration 19 BP 143/60 O2 sat any 9% temp 98.6  Ventilator Settings patient's not currently on ventilator  . General: Comfortable at this time . Eyes: Grossly normal lids, irises & conjunctiva . ENT: grossly tongue is normal . Neck: no obvious mass . Cardiovascular: S1 S2 normal no gallop . Respiratory: No rales or rhonchi noted . Abdomen: soft . Skin: no rash seen on limited exam . Musculoskeletal: not rigid . Psychiatric:unable to assess . Neurologic: no seizure no involuntary movements         Lab Data:   Basic Metabolic Panel: Recent Labs  Lab 06/13/18 1843  NA 142  K 4.0  CL 104  CO2 29  GLUCOSE 136*  BUN 11  CREATININE 0.50  CALCIUM 7.8*    ABG: No results for input(s): PHART, PCO2ART, PO2ART, HCO3, O2SAT in the last 168 hours.  Liver Function Tests: No results for input(s): AST, ALT, ALKPHOS, BILITOT, PROT, ALBUMIN in the last 168 hours. No results for input(s): LIPASE, AMYLASE in the last 168 hours. No results for input(s): AMMONIA in the last 168 hours.  CBC: Recent Labs  Lab 06/13/18 1843  WBC 12.3*  HGB 7.0*  HCT 22.7*  MCV 91.2  PLT 184    Cardiac Enzymes: No results for input(s): CKTOTAL, CKMB, CKMBINDEX, TROPONINI in the last 168 hours.  BNP (last 3 results) No results for input(s): BNP in the last 8760 hours.  ProBNP (last 3 results) No results for input(s):  PROBNP in the last 8760 hours.  Radiological Exams: US Guided Needle Placement  Result Date: 06/13/2018 INDICATION: 63 year old with a complex collection in the right lower extremity. Concern for hematoma versus abscess. EXAM: ULTRASOUND-GUIDED ASPIRATION OF RIGHT LOWER EXTREMITY HEMATOMA MEDICATIONS: None ANESTHESIA/SEDATION: None COMPLICATIONS: None immediate. PROCEDURE: Informed consent was obtained for an ultrasound-guided aspiration. A timeout was performed prior to the initiation of the procedure. Ultrasound was used to identify a complex collection along the medial aspect of the right knee. Most hypoechoic component of the collection was targeted for aspiration. The overlying skin was prepped with chlorhexidine and sterile field was created. Using ultrasound guidance, a Yueh catheter was directed into the collection but only 1 mL of dark bloody fluid could be aspirated. Yueh catheter was directed into a different components of the collection but no additional fluid could be obtained. Bandage placed over the puncture site. FINDINGS: Heterogeneous collection in the soft tissues of the medial right knee and upper calf. Findings are compatible with a complex hematoma. 1 mL of blood was aspirated. IMPRESSION: Hematoma in the medial right leg. Only 1 mL fluid could be aspirated. Fluid was sent for culture. Electronically Signed   By: Richarda Overlie M.D.   On: 06/13/2018 14:25    Assessment/Plan Active Problems:   Acute on chronic respiratory failure with hypoxia (HCC)   Tracheostomy status (HCC)   Severe sepsis (HCC)   Aspiration  pneumonia of both lower lobes due to gastric secretions (HCC)   Quadriplegia (HCC)   1. Acute on chronic respiratory failure with hypoxia continue weaning T collar as tolerated.  Goal for today is 12 hours. 2. Tracheostomy remains in place continue supportive care 3. Severe sepsis hemodynamically stable 4. Aspiration pneumonia treated continue to follow 5. Quadriplegia at  baseline   I have personally seen and evaluated the patient, evaluated laboratory and imaging results, formulated the assessment and plan and placed orders. The Patient requires high complexity decision making for assessment and support.  Case was discussed on Rounds with the Respiratory Therapy Staff  Yevonne PaxSaadat A Zariah Cavendish, MD [facility] Pulmonary Critical Care Medicine Sleep Medicine

## 2018-06-15 DIAGNOSIS — J9621 Acute and chronic respiratory failure with hypoxia: Secondary | ICD-10-CM | POA: Diagnosis not present

## 2018-06-15 DIAGNOSIS — A419 Sepsis, unspecified organism: Secondary | ICD-10-CM | POA: Diagnosis not present

## 2018-06-15 DIAGNOSIS — J690 Pneumonitis due to inhalation of food and vomit: Secondary | ICD-10-CM | POA: Diagnosis not present

## 2018-06-15 DIAGNOSIS — G8250 Quadriplegia, unspecified: Secondary | ICD-10-CM | POA: Diagnosis not present

## 2018-06-15 NOTE — Progress Notes (Addendum)
Pulmonary Critical Care Medicine [facility] [place]   PULMONARY CRITICAL CARE SERVICE  PROGRESS NOTE  Date of Service: 06/15/2018  Melissa Yates  JXB:[id number]  DOB: 18-Aug-1955   DOA: 06/01/2018  Referring Physician: Carron Curie, MD  HPI: Melissa Yates is a 63 y.o. female seen for follow up of Acute on Chronic Respiratory Failure.  Patient continues on T collar 28% FiO2.  Goal for today is 16 hours which patient continues to do and is doing well.  No acute distress is noted.  Medications: Reviewed on Rounds  Physical Exam:  Vitals: Pulse 76 respirations 23 BP 103/42 O2 sat 99% temp 98.5  Ventilator Settings patient's not currently on ventilator  . General: Comfortable at this time . Eyes: Grossly normal lids, irises & conjunctiva . ENT: grossly tongue is normal . Neck: no obvious mass . Cardiovascular: S1 S2 normal no gallop . Respiratory: No rales or rhonchi noted . Abdomen: soft . Skin: no rash seen on limited exam . Musculoskeletal: not rigid . Psychiatric:unable to assess . Neurologic: no seizure no involuntary movements         Lab Data:   Basic Metabolic Panel: Recent Labs  Lab 06/13/18 1843  NA 142  K 4.0  CL 104  CO2 29  GLUCOSE 136*  BUN 11  CREATININE 0.50  CALCIUM 7.8*    ABG: No results for input(s): PHART, PCO2ART, PO2ART, HCO3, O2SAT in the last 168 hours.  Liver Function Tests: No results for input(s): AST, ALT, ALKPHOS, BILITOT, PROT, ALBUMIN in the last 168 hours. No results for input(s): LIPASE, AMYLASE in the last 168 hours. No results for input(s): AMMONIA in the last 168 hours.  CBC: Recent Labs  Lab 06/13/18 1843  WBC 12.3*  HGB 7.0*  HCT 22.7*  MCV 91.2  PLT 184    Cardiac Enzymes: No results for input(s): CKTOTAL, CKMB, CKMBINDEX, TROPONINI in the last 168 hours.  BNP (last 3 results) No results for input(s): BNP in the last 8760 hours.  ProBNP (last 3 results) No results for input(s): PROBNP in  the last 8760 hours.  Radiological Exams: No results found.  Assessment/Plan Active Problems:   Acute on chronic respiratory failure with hypoxia (HCC)   Tracheostomy status (HCC)   Severe sepsis (HCC)   Aspiration pneumonia of both lower lobes due to gastric secretions (HCC)   Quadriplegia (HCC)   1. Acute on chronic respiratory failure with hypoxia continue weaning T collar as tolerated.  Goal for today is 16 hours. 2. Tracheostomy remains in place continue supportive care 3. Severe sepsis hemodynamically stable 4. Aspiration pneumonia treated continue to follow 5. Quadriplegia at baseline   I have personally seen and evaluated the patient, evaluated laboratory and imaging results, formulated the assessment and plan and placed orders. The Patient requires high complexity decision making for assessment and support.  Case was discussed on Rounds with the Respiratory Therapy Staff  Yevonne Pax, MD [facility] Pulmonary Critical Care Medicine Sleep Medicine

## 2018-06-16 DIAGNOSIS — A419 Sepsis, unspecified organism: Secondary | ICD-10-CM | POA: Diagnosis not present

## 2018-06-16 DIAGNOSIS — J9621 Acute and chronic respiratory failure with hypoxia: Secondary | ICD-10-CM | POA: Diagnosis not present

## 2018-06-16 DIAGNOSIS — G8250 Quadriplegia, unspecified: Secondary | ICD-10-CM | POA: Diagnosis not present

## 2018-06-16 DIAGNOSIS — J690 Pneumonitis due to inhalation of food and vomit: Secondary | ICD-10-CM | POA: Diagnosis not present

## 2018-06-16 LAB — CBC WITH DIFFERENTIAL/PLATELET
Abs Immature Granulocytes: 0.23 10*3/uL — ABNORMAL HIGH (ref 0.00–0.07)
Basophils Absolute: 0 10*3/uL (ref 0.0–0.1)
Basophils Relative: 0 %
Eosinophils Absolute: 0 10*3/uL (ref 0.0–0.5)
Eosinophils Relative: 0 %
HEMATOCRIT: 22.5 % — AB (ref 36.0–46.0)
Hemoglobin: 6.8 g/dL — CL (ref 12.0–15.0)
Immature Granulocytes: 3 %
LYMPHS ABS: 1.2 10*3/uL (ref 0.7–4.0)
LYMPHS PCT: 13 %
MCH: 28.7 pg (ref 26.0–34.0)
MCHC: 30.2 g/dL (ref 30.0–36.0)
MCV: 94.9 fL (ref 80.0–100.0)
Monocytes Absolute: 0.4 10*3/uL (ref 0.1–1.0)
Monocytes Relative: 4 %
Neutro Abs: 7.1 10*3/uL (ref 1.7–7.7)
Neutrophils Relative %: 80 %
Platelets: 200 10*3/uL (ref 150–400)
RBC: 2.37 MIL/uL — ABNORMAL LOW (ref 3.87–5.11)
RDW: 22.6 % — ABNORMAL HIGH (ref 11.5–15.5)
WBC: 8.9 10*3/uL (ref 4.0–10.5)
nRBC: 3.1 % — ABNORMAL HIGH (ref 0.0–0.2)

## 2018-06-16 LAB — PREPARE RBC (CROSSMATCH)

## 2018-06-16 LAB — AEROBIC CULTURE W GRAM STAIN (SUPERFICIAL SPECIMEN): Culture: NO GROWTH

## 2018-06-16 LAB — PROTIME-INR
INR: 1.19
Prothrombin Time: 15 seconds (ref 11.4–15.2)

## 2018-06-16 LAB — ABO/RH: ABO/RH(D): O NEG

## 2018-06-16 NOTE — Progress Notes (Addendum)
Pulmonary Critical Care Medicine [facility] [place]   PULMONARY CRITICAL CARE SERVICE  PROGRESS NOTE  Date of Service: 06/16/2018  Melissa Yates  LOV:[id number]  DOB: [birth year]   DOA: 06/01/2018  Referring Physician: Carron Curie, MD  HPI: Melissa Yates is a 63 y.o. female seen for follow up of Acute on Chronic Respiratory Failure.  Patient was able to tolerate 16 hours on T collar at 28%.  The goal today is 20 hours however RT is not confident that the patient will make it.  She appears to be tiring but continues to move at this time.  No acute distress is noted.  Medications: Reviewed on Rounds  Physical Exam:  Vitals: Pulse 88 respirations 23 BP 149/58 O2 sat 96% temp 98.5  Ventilator Settings patient's not currently on ventilator  . General: Comfortable at this time . Eyes: Grossly normal lids, irises & conjunctiva . ENT: grossly tongue is normal . Neck: no obvious mass . Cardiovascular: S1 S2 normal no gallop . Respiratory: Coarse breath sounds . Abdomen: soft . Skin: no rash seen on limited exam . Musculoskeletal: not rigid . Psychiatric:unable to assess . Neurologic: no seizure no involuntary movements         Lab Data:   Basic Metabolic Panel: Recent Labs  Lab 06/13/18 1843  NA 142  K 4.0  CL 104  CO2 29  GLUCOSE 136*  BUN 11  CREATININE 0.50  CALCIUM 7.8*    ABG: No results for input(s): PHART, PCO2ART, PO2ART, HCO3, O2SAT in the last 168 hours.  Liver Function Tests: No results for input(s): AST, ALT, ALKPHOS, BILITOT, PROT, ALBUMIN in the last 168 hours. No results for input(s): LIPASE, AMYLASE in the last 168 hours. No results for input(s): AMMONIA in the last 168 hours.  CBC: Recent Labs  Lab 06/13/18 1843 06/16/18 1140  WBC 12.3* 8.9  NEUTROABS  --  7.1  HGB 7.0* 6.8*  HCT 22.7* 22.5*  MCV 91.2 94.9  PLT 184 200    Cardiac Enzymes: No results for input(s): CKTOTAL, CKMB, CKMBINDEX, TROPONINI in the last 168  hours.  BNP (last 3 results) No results for input(s): BNP in the last 8760 hours.  ProBNP (last 3 results) No results for input(s): PROBNP in the last 8760 hours.  Radiological Exams: No results found.  Assessment/Plan Active Problems:   Acute on chronic respiratory failure with hypoxia (HCC)   Tracheostomy status (HCC)   Severe sepsis (HCC)   Aspiration pneumonia of both lower lobes due to gastric secretions (HCC)   Quadriplegia (HCC)   1. Acute on chronic respiratory failure with hypoxia continue weaning T collar as tolerated.  Goal today is 20 hours. 2. Tracheostomy remains in place continue supportive care 3. Severe sepsis hemodynamically stable 4. Aspiration pneumonia treated continue to follow 5. Quadriplegia at baseline   I have personally seen and evaluated the patient, evaluated laboratory and imaging results, formulated the assessment and plan and placed orders. The Patient requires high complexity decision making for assessment and support.  Case was discussed on Rounds with the Respiratory Therapy Staff  Yevonne Pax, MD [facility] Pulmonary Critical Care Medicine Sleep Medicine

## 2018-06-17 DIAGNOSIS — G8250 Quadriplegia, unspecified: Secondary | ICD-10-CM | POA: Diagnosis not present

## 2018-06-17 DIAGNOSIS — J9621 Acute and chronic respiratory failure with hypoxia: Secondary | ICD-10-CM | POA: Diagnosis not present

## 2018-06-17 DIAGNOSIS — J690 Pneumonitis due to inhalation of food and vomit: Secondary | ICD-10-CM | POA: Diagnosis not present

## 2018-06-17 DIAGNOSIS — A419 Sepsis, unspecified organism: Secondary | ICD-10-CM | POA: Diagnosis not present

## 2018-06-17 LAB — CBC
HCT: 27 % — ABNORMAL LOW (ref 36.0–46.0)
Hemoglobin: 8.2 g/dL — ABNORMAL LOW (ref 12.0–15.0)
MCH: 27.3 pg (ref 26.0–34.0)
MCHC: 30.4 g/dL (ref 30.0–36.0)
MCV: 90 fL (ref 80.0–100.0)
Platelets: 192 10*3/uL (ref 150–400)
RBC: 3 MIL/uL — ABNORMAL LOW (ref 3.87–5.11)
RDW: 24.1 % — ABNORMAL HIGH (ref 11.5–15.5)
WBC: 7.9 10*3/uL (ref 4.0–10.5)
nRBC: 2.1 % — ABNORMAL HIGH (ref 0.0–0.2)

## 2018-06-17 LAB — PROTIME-INR
INR: 1.47
Prothrombin Time: 17.7 seconds — ABNORMAL HIGH (ref 11.4–15.2)

## 2018-06-17 NOTE — Progress Notes (Addendum)
Pulmonary Critical Care Medicine [facility] [place]   PULMONARY CRITICAL CARE SERVICE  PROGRESS NOTE  Date of Service: 06/17/2018  Melissa Yates  YEB:[id number]  DOB: 08-05-1955   DOA: 06/01/2018  Referring Physician: Carron Curie, MD  HPI: Melissa Yates is a 63 y.o. female seen for follow up of Acute on Chronic Respiratory Failure.  As predicted yesterday patient had a goal of 20 hours on trach collar but was only able to complete 13 hours.  Today doing well remains on 28% FiO2 with a goal of 12 to 16 hours on trach collar.  Medications: Reviewed on Rounds  Physical Exam:  Vitals: Pulse 83 respirations 22 BP 180/59 O2 sat 99% temp 98.0  Ventilator Settings patient's not currently on ventilator  . General: Comfortable at this time . Eyes: Grossly normal lids, irises & conjunctiva . ENT: grossly tongue is normal . Neck: no obvious mass . Cardiovascular: S1 S2 normal no gallop . Respiratory: Coarse breath sounds . Abdomen: soft . Skin: no rash seen on limited exam . Musculoskeletal: not rigid . Psychiatric:unable to assess . Neurologic: no seizure no involuntary movements         Lab Data:   Basic Metabolic Panel: Recent Labs  Lab 06/13/18 1843  NA 142  K 4.0  CL 104  CO2 29  GLUCOSE 136*  BUN 11  CREATININE 0.50  CALCIUM 7.8*    ABG: No results for input(s): PHART, PCO2ART, PO2ART, HCO3, O2SAT in the last 168 hours.  Liver Function Tests: No results for input(s): AST, ALT, ALKPHOS, BILITOT, PROT, ALBUMIN in the last 168 hours. No results for input(s): LIPASE, AMYLASE in the last 168 hours. No results for input(s): AMMONIA in the last 168 hours.  CBC: Recent Labs  Lab 06/13/18 1843 06/16/18 1140 06/17/18 1349  WBC 12.3* 8.9 7.9  NEUTROABS  --  7.1  --   HGB 7.0* 6.8* 8.2*  HCT 22.7* 22.5* 27.0*  MCV 91.2 94.9 90.0  PLT 184 200 192    Cardiac Enzymes: No results for input(s): CKTOTAL, CKMB, CKMBINDEX, TROPONINI in the last 168  hours.  BNP (last 3 results) No results for input(s): BNP in the last 8760 hours.  ProBNP (last 3 results) No results for input(s): PROBNP in the last 8760 hours.  Radiological Exams: No results found.  Assessment/Plan Active Problems:   Acute on chronic respiratory failure with hypoxia (HCC)   Tracheostomy status (HCC)   Severe sepsis (HCC)   Aspiration pneumonia of both lower lobes due to gastric secretions (HCC)   Quadriplegia (HCC)   1. Acute on chronic respiratory failure with hypoxia continue weaning FiO2 as tolerated.  Goal today 12 to 16 hours. 2. Tracheostomy is in place continue supportive care 3. Severe sepsis hemodynamically stable 4. Aspiration pneumonia treated continue to follow 5. Quadriplegia at baseline   I have personally seen and evaluated the patient, evaluated laboratory and imaging results, formulated the assessment and plan and placed orders. The Patient requires high complexity decision making for assessment and support.  Case was discussed on Rounds with the Respiratory Therapy Staff  Yevonne Pax, MD [facility] Pulmonary Critical Care Medicine Sleep Medicine

## 2018-06-18 ENCOUNTER — Other Ambulatory Visit (HOSPITAL_COMMUNITY): Payer: Medicare Other

## 2018-06-18 DIAGNOSIS — J9621 Acute and chronic respiratory failure with hypoxia: Secondary | ICD-10-CM | POA: Diagnosis not present

## 2018-06-18 DIAGNOSIS — A419 Sepsis, unspecified organism: Secondary | ICD-10-CM | POA: Diagnosis not present

## 2018-06-18 DIAGNOSIS — J690 Pneumonitis due to inhalation of food and vomit: Secondary | ICD-10-CM | POA: Diagnosis not present

## 2018-06-18 DIAGNOSIS — G8250 Quadriplegia, unspecified: Secondary | ICD-10-CM | POA: Diagnosis not present

## 2018-06-18 LAB — CBC
HCT: 26 % — ABNORMAL LOW (ref 36.0–46.0)
HEMOGLOBIN: 7.9 g/dL — AB (ref 12.0–15.0)
MCH: 27.9 pg (ref 26.0–34.0)
MCHC: 30.4 g/dL (ref 30.0–36.0)
MCV: 91.9 fL (ref 80.0–100.0)
Platelets: 166 10*3/uL (ref 150–400)
RBC: 2.83 MIL/uL — AB (ref 3.87–5.11)
RDW: 24.3 % — ABNORMAL HIGH (ref 11.5–15.5)
WBC: 5.9 10*3/uL (ref 4.0–10.5)
nRBC: 0.8 % — ABNORMAL HIGH (ref 0.0–0.2)

## 2018-06-18 LAB — TYPE AND SCREEN
ABO/RH(D): O NEG
Antibody Screen: NEGATIVE
Unit division: 0

## 2018-06-18 LAB — BPAM RBC
Blood Product Expiration Date: 202002292359
ISSUE DATE / TIME: 202002150014
Unit Type and Rh: 9500

## 2018-06-18 LAB — PROTIME-INR
INR: 1.41
Prothrombin Time: 17.1 seconds — ABNORMAL HIGH (ref 11.4–15.2)

## 2018-06-18 MED ORDER — IOHEXOL 300 MG/ML  SOLN
100.0000 mL | Freq: Once | INTRAMUSCULAR | Status: AC | PRN
  Administered 2018-06-18: 100 mL via INTRAVENOUS

## 2018-06-18 NOTE — Progress Notes (Addendum)
Pulmonary Critical Care Medicine [facility] [place]   PULMONARY CRITICAL CARE SERVICE  PROGRESS NOTE  Date of Service: 06/18/2018  Melissa Yates  LXB:[id number]  DOB: 04/01/1956   DOA: 06/01/2018  Referring Physician: Carron Curie, MD  HPI: Melissa Yates is a 63 y.o. female seen for follow up of Acute on Chronic Respiratory Failure.  Patient was able to tolerate 11 hours on trach collar yesterday.  However today has only been able to do 1 hour.  She is currently on [facility] PC with a FiO2 20%.  Medications: Reviewed on Rounds  Physical Exam:  Vitals: Pulse 58 respiration 17 BP 136/72 O2 sat 96% temp 98.4  Ventilator Settings ventilator mode AC PC rate of 18 IP 20 PEEP of 5 FiO2 28%  . General: Comfortable at this time . Eyes: Grossly normal lids, irises & conjunctiva . ENT: grossly tongue is normal . Neck: no obvious mass . Cardiovascular: S1 S2 normal no gallop . Respiratory: Coarse breath sounds . Abdomen: soft . Skin: no rash seen on limited exam . Musculoskeletal: not rigid . Psychiatric:unable to assess . Neurologic: no seizure no involuntary movements         Lab Data:   Basic Metabolic Panel: Recent Labs  Lab 06/13/18 1843  NA 142  K 4.0  CL 104  CO2 29  GLUCOSE 136*  BUN 11  CREATININE 0.50  CALCIUM 7.8*    ABG: No results for input(s): PHART, PCO2ART, PO2ART, HCO3, O2SAT in the last 168 hours.  Liver Function Tests: No results for input(s): AST, ALT, ALKPHOS, BILITOT, PROT, ALBUMIN in the last 168 hours. No results for input(s): LIPASE, AMYLASE in the last 168 hours. No results for input(s): AMMONIA in the last 168 hours.  CBC: Recent Labs  Lab 06/13/18 1843 06/16/18 1140 06/17/18 1349 06/18/18 0500  WBC 12.3* 8.9 7.9 5.9  NEUTROABS  --  7.1  --   --   HGB 7.0* 6.8* 8.2* 7.9*  HCT 22.7* 22.5* 27.0* 26.0*  MCV 91.2 94.9 90.0 91.9  PLT 184 200 192 166    Cardiac Enzymes: No results for input(s): CKTOTAL, CKMB, CKMBINDEX,  TROPONINI in the last 168 hours.  BNP (last 3 results) No results for input(s): BNP in the last 8760 hours.  ProBNP (last 3 results) No results for input(s): PROBNP in the last 8760 hours.  Radiological Exams: No results found.  Assessment/Plan Active Problems:   Acute on chronic respiratory failure with hypoxia (HCC)   Tracheostomy status (HCC)   Severe sepsis (HCC)   Aspiration pneumonia of both lower lobes due to gastric secretions (HCC)   Quadriplegia (HCC)   1. Acute on chronic respiratory failure with hypoxia continue weaning FiO2 as tolerated. 2. Tracheostomy is in place continue supportive care 3. Severe sepsis hemodynamically stable 4. Aspiration pneumonia treated continue to follow 5. Quadriplegia at baseline   I have personally seen and evaluated the patient, evaluated laboratory and imaging results, formulated the assessment and plan and placed orders. The Patient requires high complexity decision making for assessment and support.  Case was discussed on Rounds with the Respiratory Therapy Staff  Yevonne Pax, MD [facility] Pulmonary Critical Care Medicine Sleep Medicine

## 2018-06-19 DIAGNOSIS — J690 Pneumonitis due to inhalation of food and vomit: Secondary | ICD-10-CM | POA: Diagnosis not present

## 2018-06-19 DIAGNOSIS — A419 Sepsis, unspecified organism: Secondary | ICD-10-CM | POA: Diagnosis not present

## 2018-06-19 DIAGNOSIS — J9621 Acute and chronic respiratory failure with hypoxia: Secondary | ICD-10-CM | POA: Diagnosis not present

## 2018-06-19 DIAGNOSIS — G8250 Quadriplegia, unspecified: Secondary | ICD-10-CM | POA: Diagnosis not present

## 2018-06-19 LAB — CBC
HCT: 27.7 % — ABNORMAL LOW (ref 36.0–46.0)
HEMOGLOBIN: 8.4 g/dL — AB (ref 12.0–15.0)
MCH: 28.2 pg (ref 26.0–34.0)
MCHC: 30.3 g/dL (ref 30.0–36.0)
MCV: 93 fL (ref 80.0–100.0)
Platelets: 170 10*3/uL (ref 150–400)
RBC: 2.98 MIL/uL — ABNORMAL LOW (ref 3.87–5.11)
RDW: 24.4 % — ABNORMAL HIGH (ref 11.5–15.5)
WBC: 5.7 10*3/uL (ref 4.0–10.5)
nRBC: 0.4 % — ABNORMAL HIGH (ref 0.0–0.2)

## 2018-06-19 NOTE — Progress Notes (Signed)
Pulmonary Critical Care Medicine [facility] [place]   PULMONARY CRITICAL CARE SERVICE  PROGRESS NOTE  Date of Service: 06/19/2018  SHENETRA BRAKEMAN  BXI:[id number]  DOB: 1955-05-12   DOA: 06/01/2018  Referring Physician: Carron Curie, MD  HPI: Melissa Yates is a 63 y.o. female seen for follow up of Acute on Chronic Respiratory Failure.  Patient is on a pressure support mode has been having some difficulty with weaning we will reassess again today  Medications: Reviewed on Rounds  Physical Exam:  Vitals: Temperature 96.4 pulse 63 respiratory 20 blood pressure 123/62 saturations 99%  Ventilator Settings mode ventilation pressure support FiO2 28% tidal volume 300 pressure support 12 PEEP 5  . General: Comfortable at this time . Eyes: Grossly normal lids, irises & conjunctiva . ENT: grossly tongue is normal . Neck: no obvious mass . Cardiovascular: S1 S2 normal no gallop . Respiratory: Scattered rhonchi coarse breath sounds bilaterally . Abdomen: soft . Skin: no rash seen on limited exam . Musculoskeletal: not rigid . Psychiatric:unable to assess . Neurologic: no seizure no involuntary movements         Lab Data:   Basic Metabolic Panel: Recent Labs  Lab 06/13/18 1843  NA 142  K 4.0  CL 104  CO2 29  GLUCOSE 136*  BUN 11  CREATININE 0.50  CALCIUM 7.8*    ABG: No results for input(s): PHART, PCO2ART, PO2ART, HCO3, O2SAT in the last 168 hours.  Liver Function Tests: No results for input(s): AST, ALT, ALKPHOS, BILITOT, PROT, ALBUMIN in the last 168 hours. No results for input(s): LIPASE, AMYLASE in the last 168 hours. No results for input(s): AMMONIA in the last 168 hours.  CBC: Recent Labs  Lab 06/13/18 1843 06/16/18 1140 06/17/18 1349 06/18/18 0500 06/19/18 0701  WBC 12.3* 8.9 7.9 5.9 5.7  NEUTROABS  --  7.1  --   --   --   HGB 7.0* 6.8* 8.2* 7.9* 8.4*  HCT 22.7* 22.5* 27.0* 26.0* 27.7*  MCV 91.2 94.9 90.0 91.9 93.0  PLT 184 200 192 166  170    Cardiac Enzymes: No results for input(s): CKTOTAL, CKMB, CKMBINDEX, TROPONINI in the last 168 hours.  BNP (last 3 results) No results for input(s): BNP in the last 8760 hours.  ProBNP (last 3 results) No results for input(s): PROBNP in the last 8760 hours.  Radiological Exams: Ct Extremity Lower Right W Contrast  Result Date: 06/18/2018 CLINICAL DATA:  Right lower extremity swelling and bruising. Attempted drainage of right leg last week. Evaluate for hematoma versus abscess. EXAM: CT OF THE LOWER RIGHT EXTREMITY WITH CONTRAST TECHNIQUE: Multidetector CT imaging of the lower right extremity was performed according to the standard protocol following intravenous contrast administration. Imaging for this examination extends from the pelvis into the right foot. COMPARISON:  None. CONTRAST:  OMNIPAQUE IOHEXOL 300 MG/ML  SOLN FINDINGS: Bones/Joint/Cartilage Image quality is degraded by body habitus and severe osseous demineralization. There is apparent chronic deformity at the lumbosacral junction and upper sacrum. 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 The left hip joint itself also appears partially fused. There is some deformity and sclerosis in the right femoral head which may be secondary to an old fracture. No gross subchondral collapse. There is a nonacute fracture of the distal right femur which is impacted with extensive surrounding callus formation. There are comminuted fractures of the proximal tibia and fibula which may be more recent.  These are associated with a small amount of callus formation, but do not appear healed. No acute or focal osseous abnormalities are seen in the distal lower leg or foot. Ligaments Suboptimally assessed by CT. Muscles and Tendons Severe fatty atrophy throughout the visualized pelvic and right lower extremity musculature. Soft tissues Severe subcutaneous edema  throughout the lower extremities. This appears relatively symmetric based on the scout image and is greatest in the thighs. Medially in the proximal right lower leg, there is a complex hyperdense mass or fluid collection in the subcutaneous fat, measuring up to 10.2 x 4.3 cm transverse (image 182/5) and extending 10.8 cm on sagittal image 107/9. Given the proximity to the adjacent fractures, this is likely a hematoma. There is a smaller hematoma posterior to the proximal fibula, measuring 4.3 x 2.1 cm on image 192/5. There are soft tissue calcifications posterior to the sacrum and left ischium. IMPRESSION: 1. Hyperdense masses medially in the subcutaneous fat of the right lower leg and posterior to the right fibula. Given adjacent fractures, these are presumably hematomas, although the CT appearance is nonspecific. 2. Severe edema throughout the subcutaneous fat of both lower extremities. 3. Severe diffuse fatty atrophy. 4. Multiple posttraumatic deformities involving the sacrum, right femoral neck, distal right femur, proximal tibia and proximal fibula. Electronically Signed   By: Carey Bullocks M.D.   On: 06/18/2018 17:41    Assessment/Plan Active Problems:   Acute on chronic respiratory failure with hypoxia (HCC)   Tracheostomy status (HCC)   Severe sepsis (HCC)   Aspiration pneumonia of both lower lobes due to gastric secretions (HCC)   Quadriplegia (HCC)   1. Acute on chronic respiratory failure with hypoxia patient will be continued on full support on pressure support mode.  Titrate oxygen as tolerated and assess for T collar trials again. 2. Tracheostomy permanently will remain in place 3. Severe sepsis hemodynamically doing better we will continue to monitor 4. Aspiration pneumonia treated we will continue present management 5. Quadriplegia at baseline we will continue with supportive care   I have personally seen and evaluated the patient, evaluated laboratory and imaging results,  formulated the assessment and plan and placed orders. The Patient requires high complexity decision making for assessment and support.  Case was discussed on Rounds with the Respiratory Therapy Staff  Yevonne Pax, MD [facility] Pulmonary Critical Care Medicine Sleep Medicine

## 2018-06-20 DIAGNOSIS — A419 Sepsis, unspecified organism: Secondary | ICD-10-CM | POA: Diagnosis not present

## 2018-06-20 DIAGNOSIS — G8250 Quadriplegia, unspecified: Secondary | ICD-10-CM | POA: Diagnosis not present

## 2018-06-20 DIAGNOSIS — J690 Pneumonitis due to inhalation of food and vomit: Secondary | ICD-10-CM | POA: Diagnosis not present

## 2018-06-20 DIAGNOSIS — J9621 Acute and chronic respiratory failure with hypoxia: Secondary | ICD-10-CM | POA: Diagnosis not present

## 2018-06-20 LAB — CBC
HCT: 30.1 % — ABNORMAL LOW (ref 36.0–46.0)
Hemoglobin: 8.7 g/dL — ABNORMAL LOW (ref 12.0–15.0)
MCH: 27.2 pg (ref 26.0–34.0)
MCHC: 28.9 g/dL — ABNORMAL LOW (ref 30.0–36.0)
MCV: 94.1 fL (ref 80.0–100.0)
PLATELETS: 160 10*3/uL (ref 150–400)
RBC: 3.2 MIL/uL — ABNORMAL LOW (ref 3.87–5.11)
RDW: 24.1 % — ABNORMAL HIGH (ref 11.5–15.5)
WBC: 6.1 10*3/uL (ref 4.0–10.5)
nRBC: 0.3 % — ABNORMAL HIGH (ref 0.0–0.2)

## 2018-06-20 NOTE — Progress Notes (Signed)
Pulmonary Critical Care Medicine [facility] [place]   PULMONARY CRITICAL CARE SERVICE  PROGRESS NOTE  Date of Service: 06/20/2018  Melissa Yates  VPX:[id number]  DOB: August 23, 1955   DOA: 06/01/2018  Referring Physician: Carron Curie, MD  HPI: Melissa Yates is a 63 y.o. female seen for follow up of Acute on Chronic Respiratory Failure.  Patient is on pressure support mode she has been having episodes of bradycardia into the 40s which have been mainly during sleep cardiology has seen the patient and was felt not to be a candidate for permanent pacemaker.  Medications: Reviewed on Rounds  Physical Exam:  Vitals: Temperature 97.4 pulse 48 respiratory 12 blood pressure 114/56 saturations 100%  Ventilator Settings currently on pressure support FiO2 28% pressure support 12 PEEP 5  . General: Comfortable at this time . Eyes: Grossly normal lids, irises & conjunctiva . ENT: grossly tongue is normal . Neck: no obvious mass . Cardiovascular: S1 S2 normal no gallop . Respiratory: Scattered rhonchi expansion is equal . Abdomen: soft . Skin: no rash seen on limited exam . Musculoskeletal: not rigid . Psychiatric:unable to assess . Neurologic: no seizure no involuntary movements         Lab Data:   Basic Metabolic Panel: Recent Labs  Lab 06/13/18 1843  NA 142  K 4.0  CL 104  CO2 29  GLUCOSE 136*  BUN 11  CREATININE 0.50  CALCIUM 7.8*    ABG: No results for input(s): PHART, PCO2ART, PO2ART, HCO3, O2SAT in the last 168 hours.  Liver Function Tests: No results for input(s): AST, ALT, ALKPHOS, BILITOT, PROT, ALBUMIN in the last 168 hours. No results for input(s): LIPASE, AMYLASE in the last 168 hours. No results for input(s): AMMONIA in the last 168 hours.  CBC: Recent Labs  Lab 06/16/18 1140 06/17/18 1349 06/18/18 0500 06/19/18 0701 06/20/18 0531  WBC 8.9 7.9 5.9 5.7 6.1  NEUTROABS 7.1  --   --   --   --   HGB 6.8* 8.2* 7.9* 8.4* 8.7*  HCT 22.5*  27.0* 26.0* 27.7* 30.1*  MCV 94.9 90.0 91.9 93.0 94.1  PLT 200 192 166 170 160    Cardiac Enzymes: No results for input(s): CKTOTAL, CKMB, CKMBINDEX, TROPONINI in the last 168 hours.  BNP (last 3 results) No results for input(s): BNP in the last 8760 hours.  ProBNP (last 3 results) No results for input(s): PROBNP in the last 8760 hours.  Radiological Exams: Ct Extremity Lower Right W Contrast  Result Date: 06/18/2018 CLINICAL DATA:  Right lower extremity swelling and bruising. Attempted drainage of right leg last week. Evaluate for hematoma versus abscess. EXAM: CT OF THE LOWER RIGHT EXTREMITY WITH CONTRAST TECHNIQUE: Multidetector CT imaging of the lower right extremity was performed according to the standard protocol following intravenous contrast administration. Imaging for this examination extends from the pelvis into the right foot. COMPARISON:  None. CONTRAST:  OMNIPAQUE IOHEXOL 300 MG/ML  SOLN FINDINGS: Bones/Joint/Cartilage Image quality is degraded by body habitus and severe osseous demineralization. There is apparent chronic deformity at the lumbosacral junction and upper sacrum. 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 The left hip joint itself also appears partially fused. There is some deformity and sclerosis in the right femoral head which may be secondary to an old fracture. No gross subchondral collapse. There is a nonacute fracture of the distal right femur which is impacted with extensive surrounding callus formation.  There are comminuted fractures of the proximal tibia and fibula which may be more recent. These are associated with a small amount of callus formation, but do not appear healed. No acute or focal osseous abnormalities are seen in the distal lower leg or foot. Ligaments Suboptimally assessed by CT. Muscles and Tendons Severe fatty atrophy throughout the visualized pelvic  and right lower extremity musculature. Soft tissues Severe subcutaneous edema throughout the lower extremities. This appears relatively symmetric based on the scout image and is greatest in the thighs. Medially in the proximal right lower leg, there is a complex hyperdense mass or fluid collection in the subcutaneous fat, measuring up to 10.2 x 4.3 cm transverse (image 182/5) and extending 10.8 cm on sagittal image 107/9. Given the proximity to the adjacent fractures, this is likely a hematoma. There is a smaller hematoma posterior to the proximal fibula, measuring 4.3 x 2.1 cm on image 192/5. There are soft tissue calcifications posterior to the sacrum and left ischium. IMPRESSION: 1. Hyperdense masses medially in the subcutaneous fat of the right lower leg and posterior to the right fibula. Given adjacent fractures, these are presumably hematomas, although the CT appearance is nonspecific. 2. Severe edema throughout the subcutaneous fat of both lower extremities. 3. Severe diffuse fatty atrophy. 4. Multiple posttraumatic deformities involving the sacrum, right femoral neck, distal right femur, proximal tibia and proximal fibula. Electronically Signed   By: Carey Bullocks M.D.   On: 06/18/2018 17:41    Assessment/Plan Active Problems:   Acute on chronic respiratory failure with hypoxia (HCC)   Tracheostomy status (HCC)   Severe sepsis (HCC)   Aspiration pneumonia of both lower lobes due to gastric secretions (HCC)   Quadriplegia (HCC)   1. Acute on chronic respiratory failure with hypoxia we will continue with full support at this time.  Patient was started on pressure support mode will continue to try to advance that. 2. Tracheostomy remains in place permanently 3. Severe sepsis hemodynamically stable has improved 4. Aspiration pneumonia treated 5. Quadriplegia she is at baseline 6. Bradycardia during sleep she goes down into the 40s however as noted above cardiology feels that she is not a  candidate at this time for pacemaker placement secondary to infection issues.   I have personally seen and evaluated the patient, evaluated laboratory and imaging results, formulated the assessment and plan and placed orders. The Patient requires high complexity decision making for assessment and support.  Case was discussed on Rounds with the Respiratory Therapy Staff  Yevonne Pax, MD [facility] Pulmonary Critical Care Medicine Sleep Medicine

## 2018-06-21 DIAGNOSIS — J9621 Acute and chronic respiratory failure with hypoxia: Secondary | ICD-10-CM | POA: Diagnosis not present

## 2018-06-21 DIAGNOSIS — S8011XS Contusion of right lower leg, sequela: Secondary | ICD-10-CM

## 2018-06-21 DIAGNOSIS — G8250 Quadriplegia, unspecified: Secondary | ICD-10-CM | POA: Diagnosis not present

## 2018-06-21 DIAGNOSIS — J690 Pneumonitis due to inhalation of food and vomit: Secondary | ICD-10-CM | POA: Diagnosis not present

## 2018-06-21 DIAGNOSIS — A419 Sepsis, unspecified organism: Secondary | ICD-10-CM | POA: Diagnosis not present

## 2018-06-21 NOTE — Progress Notes (Signed)
Pulmonary Critical Care Medicine [facility] [place]   PULMONARY CRITICAL CARE SERVICE  PROGRESS NOTE  Date of Service: 06/21/2018  Melissa Yates  Melissa Yates  DOB: [birth year]   DOA: 06/01/2018  Referring Physician: Carron Curie, MD  HPI: Melissa Yates is a 63 y.o. female seen for follow up of Acute on Chronic Respiratory Failure.  Patient is resting comfortably right now has been on pressure control mode.  She still having some issues with bradycardia.  Also was seen by orthopedics for hematoma of the calf results are noted and will be recommended.  Medications: Reviewed on Rounds  Physical Exam:  Vitals: Temperature 96.8 pulse 49 respiratory 15 blood pressure 114/62 saturations 100%  Ventilator Settings mode ventilation pressure assist control FiO2 28% tidal volume 345 PEEP 5  . General: Comfortable at this time . Eyes: Grossly normal lids, irises & conjunctiva . ENT: grossly tongue is normal . Neck: no obvious mass . Cardiovascular: S1 S2 normal no gallop . Respiratory: Coarse breath sounds with few rhonchi . Abdomen: soft . Skin: no rash seen on limited exam . Musculoskeletal: not rigid . Psychiatric:unable to assess . Neurologic: no seizure no involuntary movements         Lab Data:   Basic Metabolic Panel: No results for input(s): NA, K, CL, CO2, GLUCOSE, BUN, CREATININE, CALCIUM, MG, PHOS in the last 168 hours.  ABG: No results for input(s): PHART, PCO2ART, PO2ART, HCO3, O2SAT in the last 168 hours.  Liver Function Tests: No results for input(s): AST, ALT, ALKPHOS, BILITOT, PROT, ALBUMIN in the last 168 hours. No results for input(s): LIPASE, AMYLASE in the last 168 hours. No results for input(s): AMMONIA in the last 168 hours.  CBC: Recent Labs  Lab 06/16/18 1140 06/17/18 1349 06/18/18 0500 06/19/18 0701 06/20/18 0531  WBC 8.9 7.9 5.9 5.7 6.1  NEUTROABS 7.1  --   --   --   --   HGB 6.8* 8.2* 7.9* 8.4* 8.7*  HCT 22.5* 27.0* 26.0*  27.7* 30.1*  MCV 94.9 90.0 91.9 93.0 94.1  PLT 200 192 166 170 160    Cardiac Enzymes: No results for input(s): CKTOTAL, CKMB, CKMBINDEX, TROPONINI in the last 168 hours.  BNP (last 3 results) No results for input(s): BNP in the last 8760 hours.  ProBNP (last 3 results) No results for input(s): PROBNP in the last 8760 hours.  Radiological Exams: No results found.  Assessment/Plan Active Problems:   Acute on chronic respiratory failure with hypoxia (HCC)   Tracheostomy status (HCC)   Severe sepsis (HCC)   Aspiration pneumonia of both lower lobes due to gastric secretions (HCC)   Quadriplegia (HCC)   Hematoma of leg, right, sequela   1. Acute on chronic respiratory failure with hypoxia patient currently is on full vent support she is not tolerating weaning she has had issues with bradycardia as previously noted 2. Hematoma of the leg has been seen by orthopedics. 3. Severe sepsis right hemodynamically stable 4. Aspiration pneumonia treated we will continue to monitor 5. Quadriplegia unchanged 6. Tracheostomy will remain in place working towards baseline T collar   I have personally seen and evaluated the patient, evaluated laboratory and imaging results, formulated the assessment and plan and placed orders. The Patient requires high complexity decision making for assessment and support.  Case was discussed on Rounds with the Respiratory Therapy Staff  Yevonne Pax, MD [facility] Pulmonary Critical Care Medicine Sleep Medicine

## 2018-06-21 NOTE — Consult Note (Signed)
ORTHOPAEDIC CONSULTATION  REQUESTING PHYSICIAN: Carron Curie, MD  Chief Complaint: Hematoma right medial calf.  HPI: Melissa Yates is a 63 y.o. female who presents with cardiac arrest status post CPR currently intubated with contractures of both upper and lower extremities.  Patient's family states that after admission she developed a large bruising hematoma over the medial aspect of the right knee.  Past Medical History:  Diagnosis Date  . Acute on chronic respiratory failure with hypoxia (HCC)   . Cardiac arrest (HCC)   . Hemothorax   . Lobar pneumonia (HCC)   . Severe sepsis (HCC)   . Tracheostomy status (HCC)     Social History   Socioeconomic History  . Marital status: Married    Spouse name: Not on file  . Number of children: Not on file  . Years of education: Not on file  . Highest education level: Not on file  Occupational History  . Not on file  Social Needs  . Financial resource strain: Not on file  . Food insecurity:    Worry: Not on file    Inability: Not on file  . Transportation needs:    Medical: Not on file    Non-medical: Not on file  Tobacco Use  . Smoking status: Not on file  Substance and Sexual Activity  . Alcohol use: Not on file  . Drug use: Not on file  . Sexual activity: Not on file  Lifestyle  . Physical activity:    Days per week: Not on file    Minutes per session: Not on file  . Stress: Not on file  Relationships  . Social connections:    Talks on phone: Not on file    Gets together: Not on file    Attends religious service: Not on file    Active member of club or organization: Not on file    Attends meetings of clubs or organizations: Not on file    Relationship status: Not on file  Other Topics Concern  . Not on file  Social History Narrative  . Not on file   No family history on file. - negative except otherwise stated in the family history section Not on File Prior to Admission medications   Not on File   No  results found. - pertinent xrays, CT, MRI studies were reviewed and independently interpreted  Positive ROS: All other systems have been reviewed and were otherwise negative with the exception of those mentioned in the HPI and as above.  Physical Exam: General: Alert, no acute distress Psychiatric: Patient is competent for consent with normal mood and affect Lymphatic: No axillary or cervical lymphadenopathy Cardiovascular: No pedal edema Respiratory: No cyanosis, no use of accessory musculature GI: No organomegaly, abdomen is soft and non-tender    Images:  @ENCIMAGES @  Labs:  Lab Results  Component Value Date   REPTSTATUS 06/16/2018 FINAL 06/13/2018   GRAMSTAIN  06/13/2018    ABUNDANT WBC PRESENT, PREDOMINANTLY PMN NO ORGANISMS SEEN    CULT  06/13/2018    NO GROWTH 2 DAYS Performed at [facility], [address] [place], [place] [address]    LABORGA ENTEROCOCCUS FAECALIS (A) 03/25/2018    No results found for: ALBUMIN, PREALBUMIN, LABURIC  Neurologic: Patient does not have protective sensation bilateral lower extremities.   MUSCULOSKELETAL:   Skin: Examination reviewing the patient's medial right knee to the pictures of the initial trauma patient is showing slow resolution with improvement of the ecchymosis and bruising  there is no open wound no drainage.  Patient has contractures of both upper and lower extremities.  There are no ulcers on the heels.  She has pitting edema of both lower extremities.  The calf is soft there is no crepitation to palpation the swelling is resolving the ecchymosis is resolving.  Assessment: Assessment: Resolving ecchymosis and bruising medial aspect right knee most likely from blunt trauma and her anticoagulation.  Plan: Plan: Ideally compression would facilitate the resolution of the hematoma however due to the location and the risk of compression wraps folding down and causing increased pressure and increased problems I  agree with no dressings or compression at this time.  Her legs are level with her heart and this should continue to resolve uneventfully.  Thank you for the consult and the opportunity to see [name], MD [facility] [phone] 7:38 AM

## 2018-06-22 DIAGNOSIS — J9621 Acute and chronic respiratory failure with hypoxia: Secondary | ICD-10-CM | POA: Diagnosis not present

## 2018-06-22 DIAGNOSIS — G8250 Quadriplegia, unspecified: Secondary | ICD-10-CM | POA: Diagnosis not present

## 2018-06-22 DIAGNOSIS — J690 Pneumonitis due to inhalation of food and vomit: Secondary | ICD-10-CM | POA: Diagnosis not present

## 2018-06-22 DIAGNOSIS — A419 Sepsis, unspecified organism: Secondary | ICD-10-CM | POA: Diagnosis not present

## 2018-06-22 LAB — BASIC METABOLIC PANEL
Anion gap: 12 (ref 5–15)
BUN: 12 mg/dL (ref 8–23)
CO2: 32 mmol/L (ref 22–32)
Calcium: 7.7 mg/dL — ABNORMAL LOW (ref 8.9–10.3)
Chloride: 102 mmol/L (ref 98–111)
Creatinine, Ser: 0.51 mg/dL (ref 0.44–1.00)
GFR calc Af Amer: >60 mL/min (ref >60)
GFR calc non Af Amer: >60 mL/min (ref >60)
Glucose, Bld: 165 mg/dL — ABNORMAL HIGH (ref 70–99)
Potassium: 2.6 mmol/L — CL (ref 3.5–5.1)
Sodium: 146 mmol/L — ABNORMAL HIGH (ref 135–145)

## 2018-06-22 LAB — CBC
HCT: 29.3 % — ABNORMAL LOW (ref 36.0–46.0)
Hemoglobin: 8.5 g/dL — ABNORMAL LOW (ref 12.0–15.0)
MCH: 27.7 pg (ref 26.0–34.0)
MCHC: 29 g/dL — ABNORMAL LOW (ref 30.0–36.0)
MCV: 95.4 fL (ref 80.0–100.0)
Platelets: 160 10*3/uL (ref 150–400)
RBC: 3.07 MIL/uL — ABNORMAL LOW (ref 3.87–5.11)
RDW: 24.3 % — ABNORMAL HIGH (ref 11.5–15.5)
WBC: 5.9 10*3/uL (ref 4.0–10.5)
nRBC: 0.3 % — ABNORMAL HIGH (ref 0.0–0.2)

## 2018-06-22 NOTE — Progress Notes (Signed)
Pulmonary Critical Care Medicine [facility] [place]   PULMONARY CRITICAL CARE SERVICE  PROGRESS NOTE  Date of Service: 06/22/2018  Melissa Yates  VQX:[id number]  DOB: 02/23/1956   DOA: 06/01/2018  Referring Physician: Carron Curie, MD  HPI: Melissa Yates is a 63 y.o. female seen for follow up of Acute on Chronic Respiratory Failure.  Remains grossly unchanged on pressure control mode currently on 28% FiO2 with a PEEP of 5  Medications: Reviewed on Rounds  Physical Exam:  Vitals: Temperature 97.2 pulse 49 respiratory 17 blood pressure 133/66 saturations 100%  Ventilator Settings mode ventilation pressure assist control FiO2 28% tidal line 370 PEEP 5  . General: Comfortable at this time . Eyes: Grossly normal lids, irises & conjunctiva . ENT: grossly tongue is normal . Neck: no obvious mass . Cardiovascular: S1 S2 normal no gallop . Respiratory: No rhonchi or rales are noted . Abdomen: soft . Skin: no rash seen on limited exam . Musculoskeletal: not rigid . Psychiatric:unable to assess . Neurologic: no seizure no involuntary movements         Lab Data:   Basic Metabolic Panel: Recent Labs  Lab 06/22/18 0441  NA 146*  K 2.6*  CL 102  CO2 32  GLUCOSE 165*  BUN 12  CREATININE 0.51  CALCIUM 7.7*    ABG: No results for input(s): PHART, PCO2ART, PO2ART, HCO3, O2SAT in the last 168 hours.  Liver Function Tests: No results for input(s): AST, ALT, ALKPHOS, BILITOT, PROT, ALBUMIN in the last 168 hours. No results for input(s): LIPASE, AMYLASE in the last 168 hours. No results for input(s): AMMONIA in the last 168 hours.  CBC: Recent Labs  Lab 06/16/18 1140 06/17/18 1349 06/18/18 0500 06/19/18 0701 06/20/18 0531 06/22/18 0441  WBC 8.9 7.9 5.9 5.7 6.1 5.9  NEUTROABS 7.1  --   --   --   --   --   HGB 6.8* 8.2* 7.9* 8.4* 8.7* 8.5*  HCT 22.5* 27.0* 26.0* 27.7* 30.1* 29.3*  MCV 94.9 90.0 91.9 93.0 94.1 95.4  PLT 200 192 166 170 160 160     Cardiac Enzymes: No results for input(s): CKTOTAL, CKMB, CKMBINDEX, TROPONINI in the last 168 hours.  BNP (last 3 results) No results for input(s): BNP in the last 8760 hours.  ProBNP (last 3 results) No results for input(s): PROBNP in the last 8760 hours.  Radiological Exams: No results found.  Assessment/Plan Active Problems:   Acute on chronic respiratory failure with hypoxia (HCC)   Tracheostomy status (HCC)   Severe sepsis (HCC)   Aspiration pneumonia of both lower lobes due to gastric secretions (HCC)   Quadriplegia (HCC)   Hematoma of leg, right, sequela   1. Acute on chronic respiratory failure with hypoxia we will continue with full vent support at this time she is not able to wean still has been having periods of bradycardia 2. Severe sepsis hemodynamically stable we will continue with present management 3. Quadriplegia unchanged 4. Aspiration treated we will continue with supportive care 5. Hematoma follow-up primary team 6. Tracheostomy will remain in place   I have personally seen and evaluated the patient, evaluated laboratory and imaging results, formulated the assessment and plan and placed orders. The Patient requires high complexity decision making for assessment and support.  Case was discussed on Rounds with the Respiratory Therapy Staff  Yevonne Pax, MD [facility] Pulmonary Critical Care Medicine Sleep Medicine

## 2018-06-23 DIAGNOSIS — J690 Pneumonitis due to inhalation of food and vomit: Secondary | ICD-10-CM | POA: Diagnosis not present

## 2018-06-23 DIAGNOSIS — G8250 Quadriplegia, unspecified: Secondary | ICD-10-CM | POA: Diagnosis not present

## 2018-06-23 DIAGNOSIS — A419 Sepsis, unspecified organism: Secondary | ICD-10-CM | POA: Diagnosis not present

## 2018-06-23 DIAGNOSIS — J9621 Acute and chronic respiratory failure with hypoxia: Secondary | ICD-10-CM | POA: Diagnosis not present

## 2018-06-23 LAB — POTASSIUM: Potassium: 4 mmol/L (ref 3.5–5.1)

## 2018-06-23 NOTE — Progress Notes (Signed)
Pulmonary Critical Care Medicine [facility] [place]   PULMONARY CRITICAL CARE SERVICE  PROGRESS NOTE  Date of Service: 06/23/2018  Melissa Yates  ITG:[id number]  DOB: 29-Apr-1956   DOA: 06/01/2018  Referring Physician: Carron Curie, MD  HPI: Melissa Yates is a 63 y.o. female seen for follow up of Acute on Chronic Respiratory Failure.  Currently on full support on pressure control mode she is actually doing pressure support weaning this morning and tolerated it well for about 2 hours  Medications: Reviewed on Rounds  Physical Exam:  Vitals: Temperature 95.0 pulse 88 respiratory 38 blood pressure 131/74 saturations 100%  Ventilator Settings resting mode pressure assist control FiO2 28% tidal volume 370 PEEP 5 inspiratory pressure is 20  . General: Comfortable at this time . Eyes: Grossly normal lids, irises & conjunctiva . ENT: grossly tongue is normal . Neck: no obvious mass . Cardiovascular: S1 S2 normal no gallop . Respiratory: No rhonchi or rales are noted at this time . Abdomen: soft . Skin: no rash seen on limited exam . Musculoskeletal: not rigid . Psychiatric:unable to assess . Neurologic: no seizure no involuntary movements         Lab Data:   Basic Metabolic Panel: Recent Labs  Lab 06/22/18 0441 06/23/18 0630  NA 146*  --   K 2.6* 4.0  CL 102  --   CO2 32  --   GLUCOSE 165*  --   BUN 12  --   CREATININE 0.51  --   CALCIUM 7.7*  --     ABG: No results for input(s): PHART, PCO2ART, PO2ART, HCO3, O2SAT in the last 168 hours.  Liver Function Tests: No results for input(s): AST, ALT, ALKPHOS, BILITOT, PROT, ALBUMIN in the last 168 hours. No results for input(s): LIPASE, AMYLASE in the last 168 hours. No results for input(s): AMMONIA in the last 168 hours.  CBC: Recent Labs  Lab 06/17/18 1349 06/18/18 0500 06/19/18 0701 06/20/18 0531 06/22/18 0441  WBC 7.9 5.9 5.7 6.1 5.9  HGB 8.2* 7.9* 8.4* 8.7* 8.5*  HCT 27.0* 26.0* 27.7* 30.1*  29.3*  MCV 90.0 91.9 93.0 94.1 95.4  PLT 192 166 170 160 160    Cardiac Enzymes: No results for input(s): CKTOTAL, CKMB, CKMBINDEX, TROPONINI in the last 168 hours.  BNP (last 3 results) No results for input(s): BNP in the last 8760 hours.  ProBNP (last 3 results) No results for input(s): PROBNP in the last 8760 hours.  Radiological Exams: No results found.  Assessment/Plan Active Problems:   Acute on chronic respiratory failure with hypoxia (HCC)   Tracheostomy status (HCC)   Severe sepsis (HCC)   Aspiration pneumonia of both lower lobes due to gastric secretions (HCC)   Quadriplegia (HCC)   Hematoma of leg, right, sequela   1. Acute on chronic respiratory failure with hypoxia we will continue with try to make weaning attempts on pressure support continue secretion management pulmonary toilet 2. Severe sepsis resolved 3. Bradycardia stable right now not a candidate for pacemaker 4. Tracheostomy remains in place 5. Aspiration pneumonia treated clinically improving 6. Quadriplegia at baseline we will continue supportive care 7. Hematoma treated we will follow recommendations   I have personally seen and evaluated the patient, evaluated laboratory and imaging results, formulated the assessment and plan and placed orders. The Patient requires high complexity decision making for assessment and support.  Case was discussed on Rounds with the Respiratory Therapy Staff  Yevonne Pax, MD [facility] Pulmonary Critical Care  Medicine Sleep Medicine

## 2018-06-24 DIAGNOSIS — J9621 Acute and chronic respiratory failure with hypoxia: Secondary | ICD-10-CM | POA: Diagnosis not present

## 2018-06-24 DIAGNOSIS — G8250 Quadriplegia, unspecified: Secondary | ICD-10-CM | POA: Diagnosis not present

## 2018-06-24 DIAGNOSIS — A419 Sepsis, unspecified organism: Secondary | ICD-10-CM | POA: Diagnosis not present

## 2018-06-24 DIAGNOSIS — J690 Pneumonitis due to inhalation of food and vomit: Secondary | ICD-10-CM | POA: Diagnosis not present

## 2018-06-24 NOTE — Progress Notes (Addendum)
Pulmonary Critical Care Medicine [facility] [place]   PULMONARY CRITICAL CARE SERVICE  PROGRESS NOTE  Date of Service: 06/24/2018  Melissa Yates  NTI:[id number]  DOB: 1956/03/25   DOA: 06/01/2018  Referring Physician: Carron Curie, MD  HPI: Melissa Yates is a 63 y.o. female seen for follow up of Acute on Chronic Respiratory Failure.  Patient remains on assist control pressure control mode of the ventilator.  She has had some issues with bradycardia.  Currently on pressure support with a goal of 8 hours today.  Medications: Reviewed on Rounds  Physical Exam:  Vitals: Pulse 53 respirations 16 BP 135/64 O2 sat 100% temp 97.2  Ventilator Settings patient currently on AC PC rate of 15 IP 20 FiO2 28% PEEP of 5  . General: Comfortable at this time . Eyes: Grossly normal lids, irises & conjunctiva . ENT: grossly tongue is normal . Neck: no obvious mass . Cardiovascular: S1 S2 normal no gallop . Respiratory: No rales or rhonchi noted . Abdomen: soft . Skin: no rash seen on limited exam . Musculoskeletal: not rigid . Psychiatric:unable to assess . Neurologic: no seizure no involuntary movements         Lab Data:   Basic Metabolic Panel: Recent Labs  Lab 06/22/18 0441 06/23/18 0630  NA 146*  --   K 2.6* 4.0  CL 102  --   CO2 32  --   GLUCOSE 165*  --   BUN 12  --   CREATININE 0.51  --   CALCIUM 7.7*  --     ABG: No results for input(s): PHART, PCO2ART, PO2ART, HCO3, O2SAT in the last 168 hours.  Liver Function Tests: No results for input(s): AST, ALT, ALKPHOS, BILITOT, PROT, ALBUMIN in the last 168 hours. No results for input(s): LIPASE, AMYLASE in the last 168 hours. No results for input(s): AMMONIA in the last 168 hours.  CBC: Recent Labs  Lab 06/18/18 0500 06/19/18 0701 06/20/18 0531 06/22/18 0441  WBC 5.9 5.7 6.1 5.9  HGB 7.9* 8.4* 8.7* 8.5*  HCT 26.0* 27.7* 30.1* 29.3*  MCV 91.9 93.0 94.1 95.4  PLT 166 170 160 160    Cardiac  Enzymes: No results for input(s): CKTOTAL, CKMB, CKMBINDEX, TROPONINI in the last 168 hours.  BNP (last 3 results) No results for input(s): BNP in the last 8760 hours.  ProBNP (last 3 results) No results for input(s): PROBNP in the last 8760 hours.  Radiological Exams: No results found.  Assessment/Plan Active Problems:   Acute on chronic respiratory failure with hypoxia (HCC)   Tracheostomy status (HCC)   Severe sepsis (HCC)   Aspiration pneumonia of both lower lobes due to gastric secretions (HCC)   Quadriplegia (HCC)   Hematoma of leg, right, sequela   1. Acute on chronic respiratory failure with hypoxia continue with weaning trials at this time.  Continues pressure support and secretion management as well as aggressive pulmonary toilet. 2. Severe sepsis resolved 3. Bradycardia stable at this time.  Not a candidate for pacemaker. 4. Tracheostomy remains in place 5. Aspiration pneumonia treated clinically improving 6. Quadriplegia at baseline we will continue supportive care 7. Hematoma treated continue to follow recommendations   I have personally seen and evaluated the patient, evaluated laboratory and imaging results, formulated the assessment and plan and placed orders. The Patient requires high complexity decision making for assessment and support.  Case was discussed on Rounds with the Respiratory Therapy Staff  Yevonne Pax, MD [facility] Pulmonary Critical Care Medicine  Sleep Medicine

## 2018-06-25 DIAGNOSIS — J9621 Acute and chronic respiratory failure with hypoxia: Secondary | ICD-10-CM | POA: Diagnosis not present

## 2018-06-25 DIAGNOSIS — J690 Pneumonitis due to inhalation of food and vomit: Secondary | ICD-10-CM | POA: Diagnosis not present

## 2018-06-25 DIAGNOSIS — A419 Sepsis, unspecified organism: Secondary | ICD-10-CM | POA: Diagnosis not present

## 2018-06-25 DIAGNOSIS — G8250 Quadriplegia, unspecified: Secondary | ICD-10-CM | POA: Diagnosis not present

## 2018-06-25 NOTE — Progress Notes (Addendum)
Pulmonary Critical Care Medicine [facility] [place]   PULMONARY CRITICAL CARE SERVICE  PROGRESS NOTE  Date of Service: 06/25/2018  Melissa Yates  DIY:[id number]  DOB: 06-10-1955   DOA: 06/01/2018  Referring Physician: Carron Curie, MD  HPI: Melissa Yates is a 63 y.o. female seen for follow up of Acute on Chronic Respiratory Failure.  Patient remains on assisted pressure control mode.  Due to patient's bradycardia patient will not be weaned more.  She remains on ventilator AC PC with FiO2 28%.  Medications: Reviewed on Rounds  Physical Exam:  Vitals: Pulse 50 respirations 16 BP 122/57 O2 sat 100% temp 96.4  Ventilator Settings ventilator mode AC PC IP 20 rate of 15 FiO2 20% PEEP of 5  . General: Comfortable at this time . Eyes: Grossly normal lids, irises & conjunctiva . ENT: grossly tongue is normal . Neck: no obvious mass . Cardiovascular: S1 S2 normal no gallop . Respiratory: No rales or rhonchi noted . Abdomen: soft . Skin: no rash seen on limited exam . Musculoskeletal: not rigid . Psychiatric:unable to assess . Neurologic: no seizure no involuntary movements         Lab Data:   Basic Metabolic Panel: Recent Labs  Lab 06/22/18 0441 06/23/18 0630  NA 146*  --   K 2.6* 4.0  CL 102  --   CO2 32  --   GLUCOSE 165*  --   BUN 12  --   CREATININE 0.51  --   CALCIUM 7.7*  --     ABG: No results for input(s): PHART, PCO2ART, PO2ART, HCO3, O2SAT in the last 168 hours.  Liver Function Tests: No results for input(s): AST, ALT, ALKPHOS, BILITOT, PROT, ALBUMIN in the last 168 hours. No results for input(s): LIPASE, AMYLASE in the last 168 hours. No results for input(s): AMMONIA in the last 168 hours.  CBC: Recent Labs  Lab 06/19/18 0701 06/20/18 0531 06/22/18 0441  WBC 5.7 6.1 5.9  HGB 8.4* 8.7* 8.5*  HCT 27.7* 30.1* 29.3*  MCV 93.0 94.1 95.4  PLT 170 160 160    Cardiac Enzymes: No results for input(s): CKTOTAL, CKMB, CKMBINDEX,  TROPONINI in the last 168 hours.  BNP (last 3 results) No results for input(s): BNP in the last 8760 hours.  ProBNP (last 3 results) No results for input(s): PROBNP in the last 8760 hours.  Radiological Exams: No results found.  Assessment/Plan Active Problems:   Acute on chronic respiratory failure with hypoxia (HCC)   Tracheostomy status (HCC)   Severe sepsis (HCC)   Aspiration pneumonia of both lower lobes due to gastric secretions (HCC)   Quadriplegia (HCC)   Hematoma of leg, right, sequela   1. Acute on chronic respiratory failure hypoxia patient should no longer be weaned due to bradycardia.  Continue secretion management as well as aggressive pulmonary toilet. 2. Severe sepsis resolved 3. Bradycardia stable at this time not a candidate for pacemaker. 4. Tracheostomy remains in place 5. Aspiration pneumonia treated clinically improving 6. Quadriplegia at baseline we will continue supportive care 7. Hematoma treated continue to follow recommendations   I have personally seen and evaluated the patient, evaluated laboratory and imaging results, formulated the assessment and plan and placed orders. The Patient requires high complexity decision making for assessment and support.  Case was discussed on Rounds with the Respiratory Therapy Staff  Yevonne Pax, MD [facility] Pulmonary Critical Care Medicine Sleep Medicine

## 2018-06-26 DIAGNOSIS — G8250 Quadriplegia, unspecified: Secondary | ICD-10-CM | POA: Diagnosis not present

## 2018-06-26 DIAGNOSIS — J690 Pneumonitis due to inhalation of food and vomit: Secondary | ICD-10-CM | POA: Diagnosis not present

## 2018-06-26 DIAGNOSIS — A419 Sepsis, unspecified organism: Secondary | ICD-10-CM | POA: Diagnosis not present

## 2018-06-26 DIAGNOSIS — J9621 Acute and chronic respiratory failure with hypoxia: Secondary | ICD-10-CM | POA: Diagnosis not present

## 2018-06-26 NOTE — Progress Notes (Signed)
Pulmonary Critical Care Medicine [facility] [place]   PULMONARY CRITICAL CARE SERVICE  PROGRESS NOTE  Date of Service: 06/26/2018  Melissa Yates  UJW:[id number]  DOB: [birth year]   DOA: 06/01/2018  Referring Physician: Carron Curie, MD  HPI: Melissa Yates is a 63 y.o. female seen for follow up of Acute on Chronic Respiratory Failure.  Currently on full support on the ventilator supposed to have a goals of care meeting today  Medications: Reviewed on Rounds  Physical Exam:  Vitals: Temperature 98.0 pulse 62 respiratory 18 blood pressure 140/72 saturations 100%  Ventilator Settings mode ventilation pressure assist control FiO2 28% tidal 374 PEEP 5  . General: Comfortable at this time . Eyes: Grossly normal lids, irises & conjunctiva . ENT: grossly tongue is normal . Neck: no obvious mass . Cardiovascular: S1 S2 normal no gallop . Respiratory: Coarse breath sounds noted . Abdomen: soft . Skin: no rash seen on limited exam . Musculoskeletal: not rigid . Psychiatric:unable to assess . Neurologic: no seizure no involuntary movements         Lab Data:   Basic Metabolic Panel: Recent Labs  Lab 06/22/18 0441 06/23/18 0630  NA 146*  --   K 2.6* 4.0  CL 102  --   CO2 32  --   GLUCOSE 165*  --   BUN 12  --   CREATININE 0.51  --   CALCIUM 7.7*  --     ABG: No results for input(s): PHART, PCO2ART, PO2ART, HCO3, O2SAT in the last 168 hours.  Liver Function Tests: No results for input(s): AST, ALT, ALKPHOS, BILITOT, PROT, ALBUMIN in the last 168 hours. No results for input(s): LIPASE, AMYLASE in the last 168 hours. No results for input(s): AMMONIA in the last 168 hours.  CBC: Recent Labs  Lab 06/20/18 0531 06/22/18 0441  WBC 6.1 5.9  HGB 8.7* 8.5*  HCT 30.1* 29.3*  MCV 94.1 95.4  PLT 160 160    Cardiac Enzymes: No results for input(s): CKTOTAL, CKMB, CKMBINDEX, TROPONINI in the last 168 hours.  BNP (last 3 results) No results for input(s):  BNP in the last 8760 hours.  ProBNP (last 3 results) No results for input(s): PROBNP in the last 8760 hours.  Radiological Exams: No results found.  Assessment/Plan Active Problems:   Acute on chronic respiratory failure with hypoxia (HCC)   Tracheostomy status (HCC)   Severe sepsis (HCC)   Aspiration pneumonia of both lower lobes due to gastric secretions (HCC)   Quadriplegia (HCC)   Hematoma of leg, right, sequela   1. Acute on chronic respiratory failure hypoxia we will continue with supportive care right now not able to tolerate weaning remains on full support as mentioned above goals of care meeting is planned 2. Severe sepsis hemodynamically stable 3. Aspiration pneumonia has been treated has had multiple bouts 4. Quadriplegia unchanged 5. Hematology 6. Stool remains in place   I have personally seen and evaluated the patient, evaluated laboratory and imaging results, formulated the assessment and plan and placed orders. The Patient requires high complexity decision making for assessment and support.  Case was discussed on Rounds with the Respiratory Therapy Staff  Yevonne Pax, MD [facility] Pulmonary Critical Care Medicine Sleep Medicine

## 2018-06-27 DIAGNOSIS — J9621 Acute and chronic respiratory failure with hypoxia: Secondary | ICD-10-CM | POA: Diagnosis not present

## 2018-06-27 DIAGNOSIS — G8250 Quadriplegia, unspecified: Secondary | ICD-10-CM | POA: Diagnosis not present

## 2018-06-27 DIAGNOSIS — A419 Sepsis, unspecified organism: Secondary | ICD-10-CM | POA: Diagnosis not present

## 2018-06-27 DIAGNOSIS — J690 Pneumonitis due to inhalation of food and vomit: Secondary | ICD-10-CM | POA: Diagnosis not present

## 2018-06-27 NOTE — Progress Notes (Signed)
Pulmonary Critical Care Medicine [facility] [place]   PULMONARY CRITICAL CARE SERVICE  PROGRESS NOTE  Date of Service: 06/27/2018  Melissa Yates  [id number]:[id number]  DOB: 12-13-1955   DOA: 06/01/2018  Referring Physician: Carron Curie, MD  HPI: Melissa Yates is a 63 y.o. female seen for follow up of Acute on Chronic Respiratory Failure.  Patient is on full vent support this morning.  Yesterday goals of care meeting occurred and at this point the family wants the patient to be a DNR however they want to continue with all of the measures and would also possibly like a second opinion.  Her heart rhythm has been little bit more stable and have asked for respiratory therapy to reassess the possibility of weaning her.  Medications: Reviewed on Rounds  Physical Exam:  Vitals: Temperature 97.8 pulse 66 respiratory 16 blood pressure 125/58 saturations 99%  Ventilator Settings mode ventilation pressure assist control FiO2 28% tidal volume 387 PEEP 5  . General: Comfortable at this time . Eyes: Grossly normal lids, irises & conjunctiva . ENT: grossly tongue is normal . Neck: no obvious mass . Cardiovascular: S1 S2 normal no gallop . Respiratory: No rhonchi or rales are noted at this time . Abdomen: soft . Skin: no rash seen on limited exam . Musculoskeletal: not rigid . Psychiatric:unable to assess . Neurologic: no seizure no involuntary movements         Lab Data:   Basic Metabolic Panel: Recent Labs  Lab 06/22/18 0441 06/23/18 0630  NA 146*  --   K 2.6* 4.0  CL 102  --   CO2 32  --   GLUCOSE 165*  --   BUN 12  --   CREATININE 0.51  --   CALCIUM 7.7*  --     ABG: No results for input(s): PHART, PCO2ART, PO2ART, HCO3, O2SAT in the last 168 hours.  Liver Function Tests: No results for input(s): AST, ALT, ALKPHOS, BILITOT, PROT, ALBUMIN in the last 168 hours. No results for input(s): LIPASE, AMYLASE in the last 168 hours. No results for input(s): AMMONIA in  the last 168 hours.  CBC: Recent Labs  Lab 06/22/18 0441  WBC 5.9  HGB 8.5*  HCT 29.3*  MCV 95.4  PLT 160    Cardiac Enzymes: No results for input(s): CKTOTAL, CKMB, CKMBINDEX, TROPONINI in the last 168 hours.  BNP (last 3 results) No results for input(s): BNP in the last 8760 hours.  ProBNP (last 3 results) No results for input(s): PROBNP in the last 8760 hours.  Radiological Exams: No results found.  Assessment/Plan Active Problems:   Acute on chronic respiratory failure with hypoxia (HCC)   Tracheostomy status (HCC)   Severe sepsis (HCC)   Aspiration pneumonia of both lower lobes due to gastric secretions (HCC)   Quadriplegia (HCC)   Hematoma of leg, right, sequela   1. Acute on chronic respiratory failure with hypoxia we will continue with pressure assist control mode continue secretion management pulmonary toilet for resting mode.  Of asked respiratory therapy to try on pressure support today as tolerated also.  I have explained to the daughter that she may develop sudden bradycardia related to the wean and consequences are potentially cardiac arrest.  She understands and was okay with proceeding to pressure support 2. Severe sepsis hemodynamically stable and has been treated 3. Aspiration pneumonia treated with antibiotics we will continue to monitor 4. Quadriplegia unchanged 5. Hematoma per orthopedic recommendations 6. Tracheostomy remains in place   I  have personally seen and evaluated the patient, evaluated laboratory and imaging results, formulated the assessment and plan and placed orders. The Patient requires high complexity decision making for assessment and support.  Case was discussed on Rounds with the Respiratory Therapy Staff  Allyne Gee, MD [facility] Pulmonary Critical Care Medicine Sleep Medicine

## 2018-06-28 ENCOUNTER — Other Ambulatory Visit (HOSPITAL_COMMUNITY): Payer: Medicare Other

## 2018-06-28 DIAGNOSIS — A419 Sepsis, unspecified organism: Secondary | ICD-10-CM | POA: Diagnosis not present

## 2018-06-28 DIAGNOSIS — J9621 Acute and chronic respiratory failure with hypoxia: Secondary | ICD-10-CM | POA: Diagnosis not present

## 2018-06-28 DIAGNOSIS — J690 Pneumonitis due to inhalation of food and vomit: Secondary | ICD-10-CM | POA: Diagnosis not present

## 2018-06-28 DIAGNOSIS — G8250 Quadriplegia, unspecified: Secondary | ICD-10-CM | POA: Diagnosis not present

## 2018-06-28 LAB — CBC
HEMATOCRIT: 32 % — AB (ref 36.0–46.0)
HEMOGLOBIN: 9.3 g/dL — AB (ref 12.0–15.0)
MCH: 27.8 pg (ref 26.0–34.0)
MCHC: 29.1 g/dL — ABNORMAL LOW (ref 30.0–36.0)
MCV: 95.8 fL (ref 80.0–100.0)
Platelets: 153 10*3/uL (ref 150–400)
RBC: 3.34 MIL/uL — ABNORMAL LOW (ref 3.87–5.11)
RDW: 22.9 % — ABNORMAL HIGH (ref 11.5–15.5)
WBC: 4.8 10*3/uL (ref 4.0–10.5)
nRBC: 0 % (ref 0.0–0.2)

## 2018-06-28 LAB — BASIC METABOLIC PANEL
Anion gap: 7 (ref 5–15)
BUN: 16 mg/dL (ref 8–23)
CHLORIDE: 100 mmol/L (ref 98–111)
CO2: 35 mmol/L — ABNORMAL HIGH (ref 22–32)
Calcium: 7.6 mg/dL — ABNORMAL LOW (ref 8.9–10.3)
Creatinine, Ser: 0.34 mg/dL — ABNORMAL LOW (ref 0.44–1.00)
GFR calc Af Amer: >60 mL/min (ref >60)
GFR calc non Af Amer: >60 mL/min (ref >60)
Glucose, Bld: 84 mg/dL (ref 70–99)
Potassium: 3.1 mmol/L — ABNORMAL LOW (ref 3.5–5.1)
Sodium: 142 mmol/L (ref 135–145)

## 2018-06-28 LAB — POTASSIUM: Potassium: 3.2 mmol/L — ABNORMAL LOW (ref 3.5–5.1)

## 2018-06-28 NOTE — Progress Notes (Addendum)
Pulmonary Critical Care Medicine [facility] [place]   PULMONARY CRITICAL CARE SERVICE  PROGRESS NOTE  Date of Service: 06/28/2018  Melissa Yates  PVX:[id number]  DOB: Jan 14, 1956   DOA: 06/01/2018  Referring Physician: Carron Curie, MD  HPI: Melissa Yates is a 63 y.o. female seen for follow up of Acute on Chronic Respiratory Failure.  Patient remains on full ventilator support at this time.  At some point in the last 24 hours the patient was placed on levo fed to help improve her blood pressure.  Apparently cardiology came to see the patient and offered the family a pacemaker.  Medications: Reviewed on Rounds  Physical Exam:  Vitals: Pulse 63 respirations 12 BP 112/60 O2 sat 99% temp 96.3  Ventilator Settings ventilator mode AC PC rate of 18 IP 20 PEEP of 5 FiO2 28%  . General: Comfortable at this time . Eyes: Grossly normal lids, irises & conjunctiva . ENT: grossly tongue is normal . Neck: no obvious mass . Cardiovascular: S1 S2 normal no gallop . Respiratory: Breath sounds . Abdomen: soft . Skin: no rash seen on limited exam . Musculoskeletal: not rigid . Psychiatric:unable to assess . Neurologic: no seizure no involuntary movements         Lab Data:   Basic Metabolic Panel: Recent Labs  Lab 06/22/18 0441 06/23/18 0630 06/28/18 0536 06/28/18 1304  NA 146*  --  142  --   K 2.6* 4.0 3.1* 3.2*  CL 102  --  100  --   CO2 32  --  35*  --   GLUCOSE 165*  --  84  --   BUN 12  --  16  --   CREATININE 0.51  --  0.34*  --   CALCIUM 7.7*  --  7.6*  --     ABG: No results for input(s): PHART, PCO2ART, PO2ART, HCO3, O2SAT in the last 168 hours.  Liver Function Tests: No results for input(s): AST, ALT, ALKPHOS, BILITOT, PROT, ALBUMIN in the last 168 hours. No results for input(s): LIPASE, AMYLASE in the last 168 hours. No results for input(s): AMMONIA in the last 168 hours.  CBC: Recent Labs  Lab 06/22/18 0441 06/28/18 0536  WBC 5.9 4.8  HGB  8.5* 9.3*  HCT 29.3* 32.0*  MCV 95.4 95.8  PLT 160 153    Cardiac Enzymes: No results for input(s): CKTOTAL, CKMB, CKMBINDEX, TROPONINI in the last 168 hours.  BNP (last 3 results) No results for input(s): BNP in the last 8760 hours.  ProBNP (last 3 results) No results for input(s): PROBNP in the last 8760 hours.  Radiological Exams: No results found.  Assessment/Plan Active Problems:   Acute on chronic respiratory failure with hypoxia (HCC)   Tracheostomy status (HCC)   Severe sepsis (HCC)   Aspiration pneumonia of both lower lobes due to gastric secretions (HCC)   Quadriplegia (HCC)   Hematoma of leg, right, sequela   1. Acute on chronic respiratory failure with hypoxia continue with pressure assist control mode.  Continue secretion management and pulmonary toilet. 2. Severe sepsis hemodynamically stable, treated. 3. Aspiration pneumonia treated with antibiotics continue to monitor 4. Quadriplegia unchanged 5. Hematoma per orthopedic recommendations. 6. Tracheostomy remains in place   I have personally seen and evaluated the patient, evaluated laboratory and imaging results, formulated the assessment and plan and placed orders. The Patient requires high complexity decision making for assessment and support.  Case was discussed on Rounds with the Respiratory Therapy Staff  [facility]  Richardson Dopp, MD [facility] Pulmonary Critical Care Medicine Sleep Medicine

## 2018-06-28 NOTE — Consult Note (Signed)
Ref: Default, Provider, MD /Dr. Theora Gianotti   Subjective:  Awake but not communicating effectively. Heart rate in 30's to 90's. Now on IV levophed. She is on ventilator support. She has right leg large hematoma and bilateral lower extremities edema. She has h/o sepsis. She has significant sepsis few weeks ago. She also had pneumonia last month.   Objective:  Vital Signs in the last 24 hours:    Physical Exam: BP Readings from Last 1 Encounters:  03/20/18 (!) 101/51     Wt Readings from Last 1 Encounters:  No data found for Wt    Weight change:  There is no height or weight on file to calculate BMI. HEENT: [place]/AT, Eyes-Blue, Conjunctiva-Pale, Sclera-Non-icteric Neck: No JVD, No bruit, Trachea midline. Lungs:  Coarse crackles, Bilateral. Cardiac:  Regular rhythm, normal S1 and S2, no S3. II/VI systolic murmur. Abdomen:  Soft, non-tender. BS present. Extremities:  4 + edema present. No cyanosis. No clubbing. CNS: AxOx0, Cranial nerves grossly intact, Bilateral heel protectors on.   Skin: Warm and dry.   Intake/Output from previous day: No intake/output data recorded.    Lab Results: BMET    Component Value Date/Time   NA 142 06/28/2018 0536   NA 146 (H) 06/22/2018 0441   NA 142 06/13/2018 1843   K 3.2 (L) 06/28/2018 1304   K 3.1 (L) 06/28/2018 0536   K 4.0 06/23/2018 0630   CL 100 06/28/2018 0536   CL 102 06/22/2018 0441   CL 104 06/13/2018 1843   CO2 35 (H) 06/28/2018 0536   CO2 32 06/22/2018 0441   CO2 29 06/13/2018 1843   GLUCOSE 84 06/28/2018 0536   GLUCOSE 165 (H) 06/22/2018 0441   GLUCOSE 136 (H) 06/13/2018 1843   BUN 16 06/28/2018 0536   BUN 12 06/22/2018 0441   BUN 11 06/13/2018 1843   CREATININE 0.34 (L) 06/28/2018 0536   CREATININE 0.51 06/22/2018 0441   CREATININE 0.50 06/13/2018 1843   CALCIUM 7.6 (L) 06/28/2018 0536   CALCIUM 7.7 (L) 06/22/2018 0441   CALCIUM 7.8 (L) 06/13/2018 1843   GFRNONAA >60 06/28/2018 0536   GFRNONAA >60 06/22/2018 0441   GFRNONAA >60 06/13/2018 1843   GFRAA >60 06/28/2018 0536   GFRAA >60 06/22/2018 0441   GFRAA >60 06/13/2018 1843   CBC    Component Value Date/Time   WBC 4.8 06/28/2018 0536   RBC 3.34 (L) 06/28/2018 0536   HGB 9.3 (L) 06/28/2018 0536   HCT 32.0 (L) 06/28/2018 0536   PLT 153 06/28/2018 0536   MCV 95.8 06/28/2018 0536   MCH 27.8 06/28/2018 0536   MCHC 29.1 (L) 06/28/2018 0536   RDW 22.9 (H) 06/28/2018 0536   LYMPHSABS 1.2 06/16/2018 1140   MONOABS 0.4 06/16/2018 1140   EOSABS 0.0 06/16/2018 1140   BASOSABS 0.0 06/16/2018 1140   HEPATIC Function Panel No results for input(s): PROT in the last 8760 hours.  Invalid input(s):  ALBUMIN,  AST,  ALT,  ALKPHOS,  BILIDIR,  IBILI HEMOGLOBIN A1C No components found for: HGA1C,  MPG CARDIAC ENZYMES Lab Results  Component Value Date   CKTOTAL 36 (L) 03/29/2018   CKMB 1.7 03/29/2018   TROPONINI <0.03 03/29/2018   TROPONINI <0.03 03/29/2018   TROPONINI <0.03 03/29/2018   BNP No results for input(s): PROBNP in the last 8760 hours. TSH Recent Labs    06/06/18 1534  TSH 2.056   CHOLESTEROL No results for input(s): CHOL in the last 8760 hours.  Scheduled Meds: Continuous Infusions: PRN  Meds:.  Assessment/Plan: Symptomatic sinus bradycardia Acute on chronic respiratory failure Quadriplegia S/P sepsis Large right knee hematoma S/P cardiac arrest S/P aspiration pneumonia Hypoxic encephalopathy Anemia of blood loss and chronic disease  Use external pacemaker as needed. Agree with supportive care, IV inotropic agents as needed. Portable CXR. Discussed care briefly with family in room.   LOS: 0 days    Orpah Cobb  MD  06/28/2018, 5:29 PM

## 2018-06-29 DIAGNOSIS — G8250 Quadriplegia, unspecified: Secondary | ICD-10-CM | POA: Diagnosis not present

## 2018-06-29 DIAGNOSIS — A419 Sepsis, unspecified organism: Secondary | ICD-10-CM | POA: Diagnosis not present

## 2018-06-29 DIAGNOSIS — J690 Pneumonitis due to inhalation of food and vomit: Secondary | ICD-10-CM | POA: Diagnosis not present

## 2018-06-29 DIAGNOSIS — J9621 Acute and chronic respiratory failure with hypoxia: Secondary | ICD-10-CM | POA: Diagnosis not present

## 2018-06-29 NOTE — Consult Note (Signed)
Ref: Default, Provider, MD/Dr. Theora Gianotti   Subjective:  Sinus bradycardia.   Objective:  Vital Signs in the last 24 hours:    Physical Exam: BP Readings from Last 1 Encounters:  03/20/18 (!) 101/51     Wt Readings from Last 1 Encounters:  No data found for Wt    Weight change:  There is no height or weight on file to calculate BMI. .   Intake/Output from previous day: No intake/output data recorded.    Lab Results: BMET    Component Value Date/Time   NA 142 06/28/2018 0536   NA 146 (H) 06/22/2018 0441   NA 142 06/13/2018 1843   K 3.2 (L) 06/28/2018 1304   K 3.1 (L) 06/28/2018 0536   K 4.0 06/23/2018 0630   CL 100 06/28/2018 0536   CL 102 06/22/2018 0441   CL 104 06/13/2018 1843   CO2 35 (H) 06/28/2018 0536   CO2 32 06/22/2018 0441   CO2 29 06/13/2018 1843   GLUCOSE 84 06/28/2018 0536   GLUCOSE 165 (H) 06/22/2018 0441   GLUCOSE 136 (H) 06/13/2018 1843   BUN 16 06/28/2018 0536   BUN 12 06/22/2018 0441   BUN 11 06/13/2018 1843   CREATININE 0.34 (L) 06/28/2018 0536   CREATININE 0.51 06/22/2018 0441   CREATININE 0.50 06/13/2018 1843   CALCIUM 7.6 (L) 06/28/2018 0536   CALCIUM 7.7 (L) 06/22/2018 0441   CALCIUM 7.8 (L) 06/13/2018 1843   GFRNONAA >60 06/28/2018 0536   GFRNONAA >60 06/22/2018 0441   GFRNONAA >60 06/13/2018 1843   GFRAA >60 06/28/2018 0536   GFRAA >60 06/22/2018 0441   GFRAA >60 06/13/2018 1843   CBC    Component Value Date/Time   WBC 4.8 06/28/2018 0536   RBC 3.34 (L) 06/28/2018 0536   HGB 9.3 (L) 06/28/2018 0536   HCT 32.0 (L) 06/28/2018 0536   PLT 153 06/28/2018 0536   MCV 95.8 06/28/2018 0536   MCH 27.8 06/28/2018 0536   MCHC 29.1 (L) 06/28/2018 0536   RDW 22.9 (H) 06/28/2018 0536   LYMPHSABS 1.2 06/16/2018 1140   MONOABS 0.4 06/16/2018 1140   EOSABS 0.0 06/16/2018 1140   BASOSABS 0.0 06/16/2018 1140   HEPATIC Function Panel No results for input(s): PROT in the last 8760 hours.  Invalid input(s):  ALBUMIN,  AST,  ALT,  ALKPHOS,   BILIDIR,  IBILI HEMOGLOBIN A1C No components found for: HGA1C,  MPG CARDIAC ENZYMES Lab Results  Component Value Date   CKTOTAL 36 (L) 03/29/2018   CKMB 1.7 03/29/2018   TROPONINI <0.03 03/29/2018   TROPONINI <0.03 03/29/2018   TROPONINI <0.03 03/29/2018   BNP No results for input(s): PROBNP in the last 8760 hours. TSH Recent Labs    06/06/18 1534  TSH 2.056   CHOLESTEROL No results for input(s): CHOL in the last 8760 hours.  Scheduled Meds: Continuous Infusions: PRN Meds:.  Assessment/Plan: Sinus bradycardia. Acute on chronic respiratory failure Quadriplegia S/P sepsis Large right knee hematoma Possible right lung pneumonia S/P cardiac arrest S/P aspiration pneumonia S/P hypoxic encephalopathy Anemia of blood loss and chronic disease  Not a candidate for permanent pacemaker with multiple co-morbidities and significantly decreased life expectancy or meaningful life. Discussed and suggested comfort care with family member. Use external pacemaker as needed until then.   LOS: 0 days    Orpah Cobb  MD  06/29/2018, 10:04 AM

## 2018-06-29 NOTE — Progress Notes (Addendum)
Pulmonary Critical Care Medicine [facility] [place]   PULMONARY CRITICAL CARE SERVICE  PROGRESS NOTE  Date of Service: 06/29/2018  Melissa Yates  RAX:[id number]  DOB: Apr 01, 1956   DOA: 06/01/2018  Referring Physician: Carron Curie, MD  HPI: Melissa Yates is a 63 y.o. female seen for follow up of Acute on Chronic Respiratory Failure.  Patient continues on full ventilator support at this time.  The Levophed drip has been discontinued and the offer of a pacemaker from cardiology was rescinded today.  Medications: Reviewed on Rounds  Physical Exam:  Vitals: Pulse 78 respirations 24 BP 107/54 O2 sat 98% temp 98.3  Ventilator Settings ventilator mode AC PC rate of 18 IP 20 FiO2 28% PEEP of 5  . General: Comfortable at this time . Eyes: Grossly normal lids, irises & conjunctiva . ENT: grossly tongue is normal . Neck: no obvious mass . Cardiovascular: S1 S2 normal no gallop . Respiratory: Coarse breath sounds . Abdomen: soft . Skin: no rash seen on limited exam . Musculoskeletal: not rigid . Psychiatric:unable to assess . Neurologic: no seizure no involuntary movements         Lab Data:   Basic Metabolic Panel: Recent Labs  Lab 06/23/18 0630 06/28/18 0536 06/28/18 1304  NA  --  142  --   K 4.0 3.1* 3.2*  CL  --  100  --   CO2  --  35*  --   GLUCOSE  --  84  --   BUN  --  16  --   CREATININE  --  0.34*  --   CALCIUM  --  7.6*  --     ABG: No results for input(s): PHART, PCO2ART, PO2ART, HCO3, O2SAT in the last 168 hours.  Liver Function Tests: No results for input(s): AST, ALT, ALKPHOS, BILITOT, PROT, ALBUMIN in the last 168 hours. No results for input(s): LIPASE, AMYLASE in the last 168 hours. No results for input(s): AMMONIA in the last 168 hours.  CBC: Recent Labs  Lab 06/28/18 0536  WBC 4.8  HGB 9.3*  HCT 32.0*  MCV 95.8  PLT 153    Cardiac Enzymes: No results for input(s): CKTOTAL, CKMB, CKMBINDEX, TROPONINI in the last 168  hours.  BNP (last 3 results) No results for input(s): BNP in the last 8760 hours.  ProBNP (last 3 results) No results for input(s): PROBNP in the last 8760 hours.  Radiological Exams: Dg Chest Port 1 View  Result Date: 06/28/2018 CLINICAL DATA:  Heart abnormality. EXAM: PORTABLE CHEST 1 VIEW COMPARISON:  06/02/2018 FINDINGS: Examination is technically limited due to shallow inspiration and patient positioning. Tracheostomy. Vascular coils in the right mediastinum/hilar region. Postoperative changes in the cervical spine. Left PICC line with tip over the cavoatrial junction region. Shallow inspiration. Cardiac enlargement. Probable central pulmonary vascular congestion. Hazy opacity over the right lung base likely representing layering pleural effusion possibly also with right lung atelectasis or infiltration. No pneumothorax. IMPRESSION: 1. Cardiac enlargement with central pulmonary vascular congestion. 2. Right pleural effusion with infiltration or atelectasis in the right lung base. Electronically Signed   By: Burman Nieves M.D.   On: 06/28/2018 20:50    Assessment/Plan Active Problems:   Acute on chronic respiratory failure with hypoxia (HCC)   Tracheostomy status (HCC)   Severe sepsis (HCC)   Aspiration pneumonia of both lower lobes due to gastric secretions (HCC)   Quadriplegia (HCC)   Hematoma of leg, right, sequela   1. Acute on chronic respiratory failure  with hypoxia continue with pressure assist control mode.  Continue secretion management pulmonary toilet. 2. Severe sepsis hemodynamically stable. 3. Aspiration pneumonia treated continue to monitor 4. Quadriplegia unchanged 5. Hematoma per orthopedic recommendations 6. Tracheostomy remains in place   I have personally seen and evaluated the patient, evaluated laboratory and imaging results, formulated the assessment and plan and placed orders. The Patient requires high complexity decision making for assessment and  support.  Case was discussed on Rounds with the Respiratory Therapy Staff  Yevonne Pax, MD [facility] Pulmonary Critical Care Medicine Sleep Medicine

## 2018-06-30 DIAGNOSIS — J9621 Acute and chronic respiratory failure with hypoxia: Secondary | ICD-10-CM | POA: Diagnosis not present

## 2018-06-30 DIAGNOSIS — G8250 Quadriplegia, unspecified: Secondary | ICD-10-CM | POA: Diagnosis not present

## 2018-06-30 DIAGNOSIS — J690 Pneumonitis due to inhalation of food and vomit: Secondary | ICD-10-CM | POA: Diagnosis not present

## 2018-06-30 DIAGNOSIS — A419 Sepsis, unspecified organism: Secondary | ICD-10-CM | POA: Diagnosis not present

## 2018-06-30 LAB — BASIC METABOLIC PANEL
Anion gap: 8 (ref 5–15)
BUN: 18 mg/dL (ref 8–23)
CO2: 31 mmol/L (ref 22–32)
Calcium: 7.6 mg/dL — ABNORMAL LOW (ref 8.9–10.3)
Chloride: 102 mmol/L (ref 98–111)
Creatinine, Ser: 0.35 mg/dL — ABNORMAL LOW (ref 0.44–1.00)
GFR calc Af Amer: >60 mL/min (ref >60)
GFR calc non Af Amer: >60 mL/min (ref >60)
Glucose, Bld: 109 mg/dL — ABNORMAL HIGH (ref 70–99)
Potassium: 3.5 mmol/L (ref 3.5–5.1)
Sodium: 141 mmol/L (ref 135–145)

## 2018-06-30 LAB — CBC
HCT: 30 % — ABNORMAL LOW (ref 36.0–46.0)
Hemoglobin: 8.9 g/dL — ABNORMAL LOW (ref 12.0–15.0)
MCH: 28.9 pg (ref 26.0–34.0)
MCHC: 29.7 g/dL — AB (ref 30.0–36.0)
MCV: 97.4 fL (ref 80.0–100.0)
Platelets: 161 10*3/uL (ref 150–400)
RBC: 3.08 MIL/uL — ABNORMAL LOW (ref 3.87–5.11)
RDW: 22.9 % — ABNORMAL HIGH (ref 11.5–15.5)
WBC: 5.1 10*3/uL (ref 4.0–10.5)
nRBC: 0 % (ref 0.0–0.2)

## 2018-06-30 NOTE — Consult Note (Signed)
Ref: Default, Provider, MD   Subjective:  Awake. Not communicative. VS stable with heart rate in 60-70's.  Objective:  Vital Signs in the last 24 hours:    Physical Exam: BP Readings from Last 1 Encounters:  03/20/18 (!) 101/51     Wt Readings from Last 1 Encounters:  No data found for Wt    Weight change:  There is no height or weight on file to calculate BMI. HEENT: /AT, Eyes-Blue, Conjunctiva-Pale, Sclera-Non-icteric Neck: No JVD, No bruit, Trachea midline. Lungs:  Basal crackles, Bilateral. Cardiac:  Regular rhythm, normal S1 and S2, no S3. II/VI systolic murmur. Abdomen:  Soft, non-tender. BS present. Extremities:  2+ edema present. No cyanosis. No clubbing. CNS: AxOx0, Cranial nerves grossly intact.  Skin: Warm and dry.   Intake/Output from previous day: No intake/output data recorded.    Lab Results: BMET    Component Value Date/Time   NA 141 06/30/2018 0552   NA 142 06/28/2018 0536   NA 146 (H) 06/22/2018 0441   K 3.5 06/30/2018 0552   K 3.2 (L) 06/28/2018 1304   K 3.1 (L) 06/28/2018 0536   CL 102 06/30/2018 0552   CL 100 06/28/2018 0536   CL 102 06/22/2018 0441   CO2 31 06/30/2018 0552   CO2 35 (H) 06/28/2018 0536   CO2 32 06/22/2018 0441   GLUCOSE 109 (H) 06/30/2018 0552   GLUCOSE 84 06/28/2018 0536   GLUCOSE 165 (H) 06/22/2018 0441   BUN 18 06/30/2018 0552   BUN 16 06/28/2018 0536   BUN 12 06/22/2018 0441   CREATININE 0.35 (L) 06/30/2018 0552   CREATININE 0.34 (L) 06/28/2018 0536   CREATININE 0.51 06/22/2018 0441   CALCIUM 7.6 (L) 06/30/2018 0552   CALCIUM 7.6 (L) 06/28/2018 0536   CALCIUM 7.7 (L) 06/22/2018 0441   GFRNONAA >60 06/30/2018 0552   GFRNONAA >60 06/28/2018 0536   GFRNONAA >60 06/22/2018 0441   GFRAA >60 06/30/2018 0552   GFRAA >60 06/28/2018 0536   GFRAA >60 06/22/2018 0441   CBC    Component Value Date/Time   WBC 5.1 06/30/2018 0552   RBC 3.08 (L) 06/30/2018 0552   HGB 8.9 (L) 06/30/2018 0552   HCT 30.0 (L) 06/30/2018  0552   PLT 161 06/30/2018 0552   MCV 97.4 06/30/2018 0552   MCH 28.9 06/30/2018 0552   MCHC 29.7 (L) 06/30/2018 0552   RDW 22.9 (H) 06/30/2018 0552   LYMPHSABS 1.2 06/16/2018 1140   MONOABS 0.4 06/16/2018 1140   EOSABS 0.0 06/16/2018 1140   BASOSABS 0.0 06/16/2018 1140   HEPATIC Function Panel No results for input(s): PROT in the last 8760 hours.  Invalid input(s):  ALBUMIN,  AST,  ALT,  ALKPHOS,  BILIDIR,  IBILI HEMOGLOBIN A1C No components found for: HGA1C,  MPG CARDIAC ENZYMES Lab Results  Component Value Date   CKTOTAL 36 (L) 03/29/2018   CKMB 1.7 03/29/2018   TROPONINI <0.03 03/29/2018   TROPONINI <0.03 03/29/2018   TROPONINI <0.03 03/29/2018   BNP No results for input(s): PROBNP in the last 8760 hours. TSH Recent Labs    06/06/18 1534  TSH 2.056   CHOLESTEROL No results for input(s): CHOL in the last 8760 hours.  Scheduled Meds: Continuous Infusions: PRN Meds:.  Assessment/Plan: Sinus rhythm with episodes of sinus bradycardia Acute on chronic respiratory failure Quadriplegia S/P sepsis S/P aspiration pneumonia S/P cardiac arrest Hypoxic encephalopathy Anemia of blood loss and chronic disease  Will try small dose of hydralazine for reflex tachycardia Use external pacemaker  as needed if heart rate below 39 bpm and symptomatic with hypotension. Family's questions answered.   LOS: 0 days    Orpah Cobb  MD  06/30/2018, 10:14 AM

## 2018-06-30 NOTE — Progress Notes (Signed)
Pulmonary Critical Care Medicine [facility] [place]   PULMONARY CRITICAL CARE SERVICE  PROGRESS NOTE  Date of Service: 06/30/2018  Melissa Yates  MAY:[id number]  DOB: 1955-10-14   DOA: 06/01/2018  Referring Physician: Carron Curie, MD  HPI: Melissa Yates is a 63 y.o. female seen for follow up of Acute on Chronic Respiratory Failure.  Cardiology did see the patient they recommended that she is not a candidate for permanent pacemaker secondary to comorbidities.  Family is yet to decide regarding plan of care and goals of care  Medications: Reviewed on Rounds  Physical Exam:  Vitals: Temperature 97.8 pulse 53 respiratory 21 blood pressure 105/42 saturations 99%  Ventilator Settings mode ventilation pressure assist control FiO2 28% inspiratory pressure 20 PEEP 5  . General: Comfortable at this time . Eyes: Grossly normal lids, irises & conjunctiva . ENT: grossly tongue is normal . Neck: no obvious mass . Cardiovascular: S1 S2 normal no gallop . Respiratory: Coarse breath sounds noted . Abdomen: soft . Skin: no rash seen on limited exam . Musculoskeletal: not rigid . Psychiatric:unable to assess . Neurologic: no seizure no involuntary movements         Lab Data:   Basic Metabolic Panel: Recent Labs  Lab 06/28/18 0536 06/28/18 1304 06/30/18 0552  NA 142  --  141  K 3.1* 3.2* 3.5  CL 100  --  102  CO2 35*  --  31  GLUCOSE 84  --  109*  BUN 16  --  18  CREATININE 0.34*  --  0.35*  CALCIUM 7.6*  --  7.6*    ABG: No results for input(s): PHART, PCO2ART, PO2ART, HCO3, O2SAT in the last 168 hours.  Liver Function Tests: No results for input(s): AST, ALT, ALKPHOS, BILITOT, PROT, ALBUMIN in the last 168 hours. No results for input(s): LIPASE, AMYLASE in the last 168 hours. No results for input(s): AMMONIA in the last 168 hours.  CBC: Recent Labs  Lab 06/28/18 0536 06/30/18 0552  WBC 4.8 5.1  HGB 9.3* 8.9*  HCT 32.0* 30.0*  MCV 95.8 97.4  PLT  153 161    Cardiac Enzymes: No results for input(s): CKTOTAL, CKMB, CKMBINDEX, TROPONINI in the last 168 hours.  BNP (last 3 results) No results for input(s): BNP in the last 8760 hours.  ProBNP (last 3 results) No results for input(s): PROBNP in the last 8760 hours.  Radiological Exams: Dg Chest Port 1 View  Result Date: 06/28/2018 CLINICAL DATA:  Heart abnormality. EXAM: PORTABLE CHEST 1 VIEW COMPARISON:  06/02/2018 FINDINGS: Examination is technically limited due to shallow inspiration and patient positioning. Tracheostomy. Vascular coils in the right mediastinum/hilar region. Postoperative changes in the cervical spine. Left PICC line with tip over the cavoatrial junction region. Shallow inspiration. Cardiac enlargement. Probable central pulmonary vascular congestion. Hazy opacity over the right lung base likely representing layering pleural effusion possibly also with right lung atelectasis or infiltration. No pneumothorax. IMPRESSION: 1. Cardiac enlargement with central pulmonary vascular congestion. 2. Right pleural effusion with infiltration or atelectasis in the right lung base. Electronically Signed   By: Burman Nieves M.D.   On: 06/28/2018 20:50    Assessment/Plan Active Problems:   Acute on chronic respiratory failure with hypoxia (HCC)   Tracheostomy status (HCC)   Severe sepsis (HCC)   Aspiration pneumonia of both lower lobes due to gastric secretions (HCC)   Quadriplegia (HCC)   Hematoma of leg, right, sequela   1. Acute on chronic respiratory failure with hypoxia  we will continue with support.  Patient's not tolerating weaning at this point 2. Severe sepsis hemodynamically is improved off the Levophed now 3. Aspiration pneumonia treated we will continue to monitor radiologically last chest x-ray shows pulmonary vascular congestion 4. Quadriplegia unchanged 5. Hematoma continue conservative management   I have personally seen and evaluated the patient, evaluated  laboratory and imaging results, formulated the assessment and plan and placed orders. The Patient requires high complexity decision making for assessment and support.  Case was discussed on Rounds with the Respiratory Therapy Staff  Yevonne Pax, MD [facility] Pulmonary Critical Care Medicine Sleep Medicine

## 2018-07-01 DIAGNOSIS — G8250 Quadriplegia, unspecified: Secondary | ICD-10-CM | POA: Diagnosis not present

## 2018-07-01 DIAGNOSIS — A419 Sepsis, unspecified organism: Secondary | ICD-10-CM | POA: Diagnosis not present

## 2018-07-01 DIAGNOSIS — J9621 Acute and chronic respiratory failure with hypoxia: Secondary | ICD-10-CM | POA: Diagnosis not present

## 2018-07-01 DIAGNOSIS — J690 Pneumonitis due to inhalation of food and vomit: Secondary | ICD-10-CM | POA: Diagnosis not present

## 2018-07-01 NOTE — Progress Notes (Addendum)
Pulmonary Critical Care Medicine [facility] [place]   PULMONARY CRITICAL CARE SERVICE  PROGRESS NOTE  Date of Service: 07/01/2018  Melissa Yates  LID:[id number]  DOB: May 17, 1955   DOA: 06/01/2018  Referring Physician: Carron Curie, MD  HPI: Melissa Yates is a 63 y.o. female seen for follow up of Acute on Chronic Respiratory Failure.  Patient continues to rest on ventilator.  Not currently weaning patient.  She is no longer on a Levophed drip however her pressure continues to be on the lower side.  Medications: Reviewed on Rounds  Physical Exam:  Vitals: Pulse 62 respirations 19 BP 108/53 O2 sat 98% temp 99.2  Ventilator Settings ventilator mode AC PC rate of 18 IP 20 PEEP of 5 FiO2 28%  . General: Comfortable at this time . Eyes: Grossly normal lids, irises & conjunctiva . ENT: grossly tongue is normal . Neck: no obvious mass . Cardiovascular: S1 S2 normal no gallop . Respiratory: Coarse breath sounds . Abdomen: soft . Skin: no rash seen on limited exam . Musculoskeletal: not rigid . Psychiatric:unable to assess . Neurologic: no seizure no involuntary movements         Lab Data:   Basic Metabolic Panel: Recent Labs  Lab 06/28/18 0536 06/28/18 1304 06/30/18 0552  NA 142  --  141  K 3.1* 3.2* 3.5  CL 100  --  102  CO2 35*  --  31  GLUCOSE 84  --  109*  BUN 16  --  18  CREATININE 0.34*  --  0.35*  CALCIUM 7.6*  --  7.6*    ABG: No results for input(s): PHART, PCO2ART, PO2ART, HCO3, O2SAT in the last 168 hours.  Liver Function Tests: No results for input(s): AST, ALT, ALKPHOS, BILITOT, PROT, ALBUMIN in the last 168 hours. No results for input(s): LIPASE, AMYLASE in the last 168 hours. No results for input(s): AMMONIA in the last 168 hours.  CBC: Recent Labs  Lab 06/28/18 0536 06/30/18 0552  WBC 4.8 5.1  HGB 9.3* 8.9*  HCT 32.0* 30.0*  MCV 95.8 97.4  PLT 153 161    Cardiac Enzymes: No results for input(s): CKTOTAL, CKMB, CKMBINDEX,  TROPONINI in the last 168 hours.  BNP (last 3 results) No results for input(s): BNP in the last 8760 hours.  ProBNP (last 3 results) No results for input(s): PROBNP in the last 8760 hours.  Radiological Exams: No results found.  Assessment/Plan Active Problems:   Acute on chronic respiratory failure with hypoxia (HCC)   Tracheostomy status (HCC)   Severe sepsis (HCC)   Aspiration pneumonia of both lower lobes due to gastric secretions (HCC)   Quadriplegia (HCC)   Hematoma of leg, right, sequela   1. Acute on chronic respiratory failure with hypoxia continue with supportive care patient not tolerating weaning still.  No longer a candidate for pacemaker and family is deciding on goals of care at this time. 2. Severe sepsis hemodynamically stable improved and off of Levophed at this time. 3. Aspiration pneumonia treated continue to monitor 4. Quadriplegia unchanged 5. Hematoma continue conservative management   I have personally seen and evaluated the patient, evaluated laboratory and imaging results, formulated the assessment and plan and placed orders. The Patient requires high complexity decision making for assessment and support.  Case was discussed on Rounds with the Respiratory Therapy Staff  Yevonne Pax, MD [facility] Pulmonary Critical Care Medicine Sleep Medicine

## 2018-07-02 DIAGNOSIS — J690 Pneumonitis due to inhalation of food and vomit: Secondary | ICD-10-CM | POA: Diagnosis not present

## 2018-07-02 DIAGNOSIS — J9621 Acute and chronic respiratory failure with hypoxia: Secondary | ICD-10-CM | POA: Diagnosis not present

## 2018-07-02 DIAGNOSIS — A419 Sepsis, unspecified organism: Secondary | ICD-10-CM | POA: Diagnosis not present

## 2018-07-02 DIAGNOSIS — G8250 Quadriplegia, unspecified: Secondary | ICD-10-CM | POA: Diagnosis not present

## 2018-07-02 NOTE — Progress Notes (Addendum)
Pulmonary Critical Care Medicine [facility] [place]   PULMONARY CRITICAL CARE SERVICE  PROGRESS NOTE  Date of Service: 07/02/2018  Melissa Yates  AXK:[id number]  DOB: November 14, 1955   DOA: 06/01/2018  Referring Physician: Carron Curie, MD  HPI: Melissa Yates is a 63 y.o. female seen for follow up of Acute on Chronic Respiratory Failure.  Patient was having decreased compliance on assessment today.  Tidal volume was decreased.  Patient's IP pressure is increased to 22.    Medications: Reviewed on Rounds  Physical Exam:  Vitals: Pulse 66 respiration 23 BP 154/67 O2 sat 95% temp 97.8  Ventilator Settings ventilator mode AC PC rate of 18 IP 22 PEEP of 5 FiO2 28%.  . General: Comfortable at this time . Eyes: Grossly normal lids, irises & conjunctiva . ENT: grossly tongue is normal . Neck: no obvious mass . Cardiovascular: S1 S2 normal no gallop . Respiratory: Coarse breath sounds . Abdomen: soft . Skin: no rash seen on limited exam . Musculoskeletal: not rigid . Psychiatric:unable to assess . Neurologic: no seizure no involuntary movements         Lab Data:   Basic Metabolic Panel: Recent Labs  Lab 06/28/18 0536 06/28/18 1304 06/30/18 0552  NA 142  --  141  K 3.1* 3.2* 3.5  CL 100  --  102  CO2 35*  --  31  GLUCOSE 84  --  109*  BUN 16  --  18  CREATININE 0.34*  --  0.35*  CALCIUM 7.6*  --  7.6*    ABG: No results for input(s): PHART, PCO2ART, PO2ART, HCO3, O2SAT in the last 168 hours.  Liver Function Tests: No results for input(s): AST, ALT, ALKPHOS, BILITOT, PROT, ALBUMIN in the last 168 hours. No results for input(s): LIPASE, AMYLASE in the last 168 hours. No results for input(s): AMMONIA in the last 168 hours.  CBC: Recent Labs  Lab 06/28/18 0536 06/30/18 0552  WBC 4.8 5.1  HGB 9.3* 8.9*  HCT 32.0* 30.0*  MCV 95.8 97.4  PLT 153 161    Cardiac Enzymes: No results for input(s): CKTOTAL, CKMB, CKMBINDEX, TROPONINI in the last 168  hours.  BNP (last 3 results) No results for input(s): BNP in the last 8760 hours.  ProBNP (last 3 results) No results for input(s): PROBNP in the last 8760 hours.  Radiological Exams: No results found.  Assessment/Plan Active Problems:   Acute on chronic respiratory failure with hypoxia (HCC)   Tracheostomy status (HCC)   Severe sepsis (HCC)   Aspiration pneumonia of both lower lobes due to gastric secretions (HCC)   Quadriplegia (HCC)   Hematoma of leg, right, sequela   1. Acute on chronic respiratory failure with hypoxia continue with supportive care.  Patient cannot tolerate weaning at this time.  Continue supportive measures and full support on ventilator AC PC mode. 2. Severe sepsis hemodynamically stable 3. Aspiration pneumonia treated continue to monitor 4. Quadriplegia unchanged 5. Hematoma continue conservative management   I have personally seen and evaluated the patient, evaluated laboratory and imaging results, formulated the assessment and plan and placed orders. The Patient requires high complexity decision making for assessment and support.  Case was discussed on Rounds with the Respiratory Therapy Staff  Yevonne Pax, MD [facility] Pulmonary Critical Care Medicine Sleep Medicine

## 2018-07-03 DIAGNOSIS — J9621 Acute and chronic respiratory failure with hypoxia: Secondary | ICD-10-CM | POA: Diagnosis not present

## 2018-07-03 DIAGNOSIS — G8250 Quadriplegia, unspecified: Secondary | ICD-10-CM | POA: Diagnosis not present

## 2018-07-03 DIAGNOSIS — J690 Pneumonitis due to inhalation of food and vomit: Secondary | ICD-10-CM | POA: Diagnosis not present

## 2018-07-03 DIAGNOSIS — A419 Sepsis, unspecified organism: Secondary | ICD-10-CM | POA: Diagnosis not present

## 2018-07-03 NOTE — Progress Notes (Addendum)
Pulmonary Critical Care Medicine [facility] [place]   PULMONARY CRITICAL CARE SERVICE  PROGRESS NOTE  Date of Service: 07/03/2018  Melissa Yates  SLH:[id number]  DOB: Nov 07, 1955   DOA: 06/01/2018  Referring Physician: Carron Curie, MD  HPI: Melissa Yates is a 63 y.o. female seen for follow up of Acute on Chronic Respiratory Failure.  Patient remains on full support at this time.  FiO2 is 28%.  Yesterday her IV pressure was increased to 22 and she continues to do well on this at this time.  Medications: Reviewed on Rounds  Physical Exam:  Vitals: Pulse 88 respirations 24 BP 101/54 O2 sat 96% temp 98.6  Ventilator Settings ventilator mode AC PC rate of 18 IP 22 PEEP of 5 FiO2 28%  . General: Comfortable at this time . Eyes: Grossly normal lids, irises & conjunctiva . ENT: grossly tongue is normal . Neck: no obvious mass . Cardiovascular: S1 S2 normal no gallop . Respiratory: Coarse breath sounds . Abdomen: soft . Skin: no rash seen on limited exam . Musculoskeletal: not rigid . Psychiatric:unable to assess . Neurologic: no seizure no involuntary movements         Lab Data:   Basic Metabolic Panel: Recent Labs  Lab 06/28/18 0536 06/28/18 1304 06/30/18 0552  NA 142  --  141  K 3.1* 3.2* 3.5  CL 100  --  102  CO2 35*  --  31  GLUCOSE 84  --  109*  BUN 16  --  18  CREATININE 0.34*  --  0.35*  CALCIUM 7.6*  --  7.6*    ABG: No results for input(s): PHART, PCO2ART, PO2ART, HCO3, O2SAT in the last 168 hours.  Liver Function Tests: No results for input(s): AST, ALT, ALKPHOS, BILITOT, PROT, ALBUMIN in the last 168 hours. No results for input(s): LIPASE, AMYLASE in the last 168 hours. No results for input(s): AMMONIA in the last 168 hours.  CBC: Recent Labs  Lab 06/28/18 0536 06/30/18 0552  WBC 4.8 5.1  HGB 9.3* 8.9*  HCT 32.0* 30.0*  MCV 95.8 97.4  PLT 153 161    Cardiac Enzymes: No results for input(s): CKTOTAL, CKMB, CKMBINDEX,  TROPONINI in the last 168 hours.  BNP (last 3 results) No results for input(s): BNP in the last 8760 hours.  ProBNP (last 3 results) No results for input(s): PROBNP in the last 8760 hours.  Radiological Exams: No results found.  Assessment/Plan Active Problems:   Acute on chronic respiratory failure with hypoxia (HCC)   Tracheostomy status (HCC)   Severe sepsis (HCC)   Aspiration pneumonia of both lower lobes due to gastric secretions (HCC)   Quadriplegia (HCC)   Hematoma of leg, right, sequela   1. Acute on chronic respiratory failure with hypoxia continue with supportive care.  Patient cannot tolerate weaning at this time.  Continue supportive measures and full support on ventilator AC PC mode. 2. Severe sepsis hemodynamically stable 3. Aspiration pneumonia treated continue to monitor 4. Quadriplegia unchanged 5. Hematoma continue conservative management   I have personally seen and evaluated the patient, evaluated laboratory and imaging results, formulated the assessment and plan and placed orders. The Patient requires high complexity decision making for assessment and support.  Case was discussed on Rounds with the Respiratory Therapy Staff  Yevonne Pax, MD [facility] Pulmonary Critical Care Medicine Sleep Medicine

## 2018-07-04 DIAGNOSIS — G8250 Quadriplegia, unspecified: Secondary | ICD-10-CM | POA: Diagnosis not present

## 2018-07-04 DIAGNOSIS — J690 Pneumonitis due to inhalation of food and vomit: Secondary | ICD-10-CM | POA: Diagnosis not present

## 2018-07-04 DIAGNOSIS — A419 Sepsis, unspecified organism: Secondary | ICD-10-CM | POA: Diagnosis not present

## 2018-07-04 DIAGNOSIS — J9621 Acute and chronic respiratory failure with hypoxia: Secondary | ICD-10-CM | POA: Diagnosis not present

## 2018-07-04 NOTE — Progress Notes (Signed)
Pulmonary Critical Care Medicine [facility] [place]   PULMONARY CRITICAL CARE SERVICE  PROGRESS NOTE  Date of Service: 07/04/2018  Melissa Yates  [id number]:[id number]  DOB: [birth year]   DOA: 06/01/2018  Referring Physician: Carron Curie, MD  HPI: Melissa Yates is a 63 y.o. female seen for follow up of Acute on Chronic Respiratory Failure.  Patient is on full vent support on pressure control mode.  She has been failing weaning attempts.  Family is going to be taking her home with the ventilator  Medications: Reviewed on Rounds  Physical Exam:  Vitals: Temperature 97.0 pulse 78 respiratory 24 blood pressure 119/57 saturations 98%  Ventilator Settings mode ventilation pressure assist control FiO2 28% tidal volume 391 PEEP 5  . General: Comfortable at this time . Eyes: Grossly normal lids, irises & conjunctiva . ENT: grossly tongue is normal . Neck: no obvious mass . Cardiovascular: S1 S2 normal no gallop . Respiratory: No rhonchi or rales are noted . Abdomen: soft . Skin: no rash seen on limited exam . Musculoskeletal: not rigid . Psychiatric:unable to assess . Neurologic: no seizure no involuntary movements         Lab Data:   Basic Metabolic Panel: Recent Labs  Lab 06/28/18 0536 06/28/18 1304 06/30/18 0552  NA 142  --  141  K 3.1* 3.2* 3.5  CL 100  --  102  CO2 35*  --  31  GLUCOSE 84  --  109*  BUN 16  --  18  CREATININE 0.34*  --  0.35*  CALCIUM 7.6*  --  7.6*    ABG: No results for input(s): PHART, PCO2ART, PO2ART, HCO3, O2SAT in the last 168 hours.  Liver Function Tests: No results for input(s): AST, ALT, ALKPHOS, BILITOT, PROT, ALBUMIN in the last 168 hours. No results for input(s): LIPASE, AMYLASE in the last 168 hours. No results for input(s): AMMONIA in the last 168 hours.  CBC: Recent Labs  Lab 06/28/18 0536 06/30/18 0552  WBC 4.8 5.1  HGB 9.3* 8.9*  HCT 32.0* 30.0*  MCV 95.8 97.4  PLT 153 161    Cardiac Enzymes: No results  for input(s): CKTOTAL, CKMB, CKMBINDEX, TROPONINI in the last 168 hours.  BNP (last 3 results) No results for input(s): BNP in the last 8760 hours.  ProBNP (last 3 results) No results for input(s): PROBNP in the last 8760 hours.  Radiological Exams: No results found.  Assessment/Plan Active Problems:   Acute on chronic respiratory failure with hypoxia (HCC)   Tracheostomy status (HCC)   Severe sepsis (HCC)   Aspiration pneumonia of both lower lobes due to gastric secretions (HCC)   Quadriplegia (HCC)   Hematoma of leg, right, sequela   1. Acute on chronic respiratory failure with hypoxia we will continue with full support on the ventilator she is to be home drained and then discharged with family home 2. Severe sepsis resolved 3. Aspiration pneumonia treated we will continue with supportive care 4. Quadriplegia at baseline 5. Tracheostomy remains in place not able to wean   I have personally seen and evaluated the patient, evaluated laboratory and imaging results, formulated the assessment and plan and placed orders. The Patient requires high complexity decision making for assessment and support.  Case was discussed on Rounds with the Respiratory Therapy Staff  Yevonne Pax, MD [facility] Pulmonary Critical Care Medicine Sleep Medicine

## 2018-07-05 DIAGNOSIS — A419 Sepsis, unspecified organism: Secondary | ICD-10-CM | POA: Diagnosis not present

## 2018-07-05 DIAGNOSIS — J690 Pneumonitis due to inhalation of food and vomit: Secondary | ICD-10-CM | POA: Diagnosis not present

## 2018-07-05 DIAGNOSIS — J9621 Acute and chronic respiratory failure with hypoxia: Secondary | ICD-10-CM | POA: Diagnosis not present

## 2018-07-05 DIAGNOSIS — G8250 Quadriplegia, unspecified: Secondary | ICD-10-CM | POA: Diagnosis not present

## 2018-07-05 LAB — CBC
HCT: 31.4 % — ABNORMAL LOW (ref 36.0–46.0)
Hemoglobin: 9.4 g/dL — ABNORMAL LOW (ref 12.0–15.0)
MCH: 28.5 pg (ref 26.0–34.0)
MCHC: 29.9 g/dL — ABNORMAL LOW (ref 30.0–36.0)
MCV: 95.2 fL (ref 80.0–100.0)
NRBC: 0 % (ref 0.0–0.2)
Platelets: 157 10*3/uL (ref 150–400)
RBC: 3.3 MIL/uL — ABNORMAL LOW (ref 3.87–5.11)
RDW: 20.6 % — ABNORMAL HIGH (ref 11.5–15.5)
WBC: 4.9 10*3/uL (ref 4.0–10.5)

## 2018-07-05 NOTE — Progress Notes (Signed)
Pulmonary Critical Care Medicine [facility] [place]   PULMONARY CRITICAL CARE SERVICE  PROGRESS NOTE  Date of Service: 07/05/2018  Melissa Yates  [id number]:[id number]  DOB: Nov 03, 1955   DOA: 06/01/2018  Referring Physician: Carron Curie, MD  HPI: Melissa Yates is a 63 y.o. female seen for follow up of Acute on Chronic Respiratory Failure.  Patient remains on pressure control mode awaiting training for the family for home ventilator use  Medications: Reviewed on Rounds  Physical Exam:  Vitals: Temperature 97.7 pulse 64 respiratory 24 blood pressure 131/69 saturations 99%  Ventilator Settings mode ventilation pressure assist control FiO2 28% tidal volume 417 PEEP 5  . General: Comfortable at this time . Eyes: Grossly normal lids, irises & conjunctiva . ENT: grossly tongue is normal . Neck: no obvious mass . Cardiovascular: S1 S2 normal no gallop . Respiratory: No rhonchi or rales are noted at this time . Abdomen: soft . Skin: no rash seen on limited exam . Musculoskeletal: not rigid . Psychiatric:unable to assess . Neurologic: no seizure no involuntary movements         Lab Data:   Basic Metabolic Panel: Recent Labs  Lab 06/30/18 0552  NA 141  K 3.5  CL 102  CO2 31  GLUCOSE 109*  BUN 18  CREATININE 0.35*  CALCIUM 7.6*    ABG: No results for input(s): PHART, PCO2ART, PO2ART, HCO3, O2SAT in the last 168 hours.  Liver Function Tests: No results for input(s): AST, ALT, ALKPHOS, BILITOT, PROT, ALBUMIN in the last 168 hours. No results for input(s): LIPASE, AMYLASE in the last 168 hours. No results for input(s): AMMONIA in the last 168 hours.  CBC: Recent Labs  Lab 06/30/18 0552 07/05/18 0453  WBC 5.1 4.9  HGB 8.9* 9.4*  HCT 30.0* 31.4*  MCV 97.4 95.2  PLT 161 157    Cardiac Enzymes: No results for input(s): CKTOTAL, CKMB, CKMBINDEX, TROPONINI in the last 168 hours.  BNP (last 3 results) No results for input(s): BNP in the last 8760  hours.  ProBNP (last 3 results) No results for input(s): PROBNP in the last 8760 hours.  Radiological Exams: No results found.  Assessment/Plan Active Problems:   Acute on chronic respiratory failure with hypoxia (HCC)   Tracheostomy status (HCC)   Severe sepsis (HCC)   Aspiration pneumonia of both lower lobes due to gastric secretions (HCC)   Quadriplegia (HCC)   Hematoma of leg, right, sequela   1. Acute on chronic respiratory failure with hypoxia we will continue with full vent support.  Home training is to be done so patient can be discharged 2. Tracheostomy remains in place 3. Severe sepsis resolved 4. Aspiration pneumonia treated we will continue to monitor 5. Quadriplegia at baseline 6. Hematoma treated   I have personally seen and evaluated the patient, evaluated laboratory and imaging results, formulated the assessment and plan and placed orders. The Patient requires high complexity decision making for assessment and support.  Case was discussed on Rounds with the Respiratory Therapy Staff  Yevonne Pax, MD [facility] Pulmonary Critical Care Medicine Sleep Medicine

## 2018-07-06 DIAGNOSIS — J690 Pneumonitis due to inhalation of food and vomit: Secondary | ICD-10-CM | POA: Diagnosis not present

## 2018-07-06 DIAGNOSIS — G8250 Quadriplegia, unspecified: Secondary | ICD-10-CM | POA: Diagnosis not present

## 2018-07-06 DIAGNOSIS — A419 Sepsis, unspecified organism: Secondary | ICD-10-CM | POA: Diagnosis not present

## 2018-07-06 DIAGNOSIS — J9621 Acute and chronic respiratory failure with hypoxia: Secondary | ICD-10-CM | POA: Diagnosis not present

## 2018-07-06 NOTE — Progress Notes (Addendum)
Pulmonary Critical Care Medicine [facility] [place]   PULMONARY CRITICAL CARE SERVICE  PROGRESS NOTE  Date of Service: 07/06/2018  ZAN LOAYZA  [id number]  DOB: 1955-12-15   DOA: 06/01/2018  Referring Physician: Carron Curie, MD  HPI: Melissa Yates is a 63 y.o. female seen for follow up of Acute on Chronic Respiratory Failure.  Patient continues on assist control pressure control mode.  FiO2 is 28%.  Awaiting family training for home ventilator use.  Otherwise doing well.   Medications: Reviewed on Rounds  Physical Exam:  Vitals:  Pulse 72 respirations 18 BP 113/58 O2 sat 99% temp 97.8  Ventilator Settings ventilator mode AC PC rate of 18, IP 22 PEEP of 5 FiO2 28%.  . General: Comfortable at this time . Eyes: Grossly normal lids, irises & conjunctiva . ENT: grossly tongue is normal . Neck: no obvious mass . Cardiovascular: S1 S2 normal no gallop . Respiratory: No rales or rhonchi noted . Abdomen: soft . Skin: no rash seen on limited exam . Musculoskeletal: not rigid . Psychiatric:unable to assess . Neurologic: no seizure no involuntary movements         Lab Data:   Basic Metabolic Panel: Recent Labs  Lab 06/30/18 0552  NA 141  K 3.5  CL 102  CO2 31  GLUCOSE 109*  BUN 18  CREATININE 0.35*  CALCIUM 7.6*    ABG: No results for input(s): PHART, PCO2ART, PO2ART, HCO3, O2SAT in the last 168 hours.  Liver Function Tests: No results for input(s): AST, ALT, ALKPHOS, BILITOT, PROT, ALBUMIN in the last 168 hours. No results for input(s): LIPASE, AMYLASE in the last 168 hours. No results for input(s): AMMONIA in the last 168 hours.  CBC: Recent Labs  Lab 06/30/18 0552 07/05/18 0453  WBC 5.1 4.9  HGB 8.9* 9.4*  HCT 30.0* 31.4*  MCV 97.4 95.2  PLT 161 157    Cardiac Enzymes: No results for input(s): CKTOTAL, CKMB, CKMBINDEX, TROPONINI in the last 168 hours.  BNP (last 3 results) No results for input(s): BNP in the last 8760  hours.  ProBNP (last 3 results) No results for input(s): PROBNP in the last 8760 hours.  Radiological Exams: No results found.  Assessment/Plan Active Problems:   Acute on chronic respiratory failure with hypoxia (HCC)   Tracheostomy status (HCC)   Severe sepsis (HCC)   Aspiration pneumonia of both lower lobes due to gastric secretions (HCC)   Quadriplegia (HCC)   Hematoma of leg, right, sequela   1. Acute on chronic respiratory failure with hypoxia continue with full vent support.  Patient's found members will need home training to operate home ventilator before patient can be discharged. 2. Tracheostomy remains in place 3. Severe sepsis resolved 4. Aspiration pneumonia treated continue to monitor 5. Quadriplegia at baseline 6. Hematoma treated   I have personally seen and evaluated the patient, evaluated laboratory and imaging results, formulated the assessment and plan and placed orders. The Patient requires high complexity decision making for assessment and support.  Case was discussed on Rounds with the Respiratory Therapy Staff  Yevonne Pax, MD [facility] Pulmonary Critical Care Medicine Sleep Medicine

## 2018-07-07 DIAGNOSIS — G8250 Quadriplegia, unspecified: Secondary | ICD-10-CM | POA: Diagnosis not present

## 2018-07-07 DIAGNOSIS — J690 Pneumonitis due to inhalation of food and vomit: Secondary | ICD-10-CM | POA: Diagnosis not present

## 2018-07-07 DIAGNOSIS — J9621 Acute and chronic respiratory failure with hypoxia: Secondary | ICD-10-CM | POA: Diagnosis not present

## 2018-07-07 DIAGNOSIS — A419 Sepsis, unspecified organism: Secondary | ICD-10-CM | POA: Diagnosis not present

## 2018-07-07 NOTE — Progress Notes (Signed)
Pulmonary Critical Care Medicine [facility] [place]   PULMONARY CRITICAL CARE SERVICE  PROGRESS NOTE  Date of Service: 07/07/2018  Melissa Yates  [id number]  DOB: 1956/04/02   DOA: 06/01/2018  Referring Physician: Carron Curie, MD  HPI: Melissa Yates is a 63 y.o. female seen for follow up of Acute on Chronic Respiratory Failure.  Patient is on full support right now is on pressure assist control mode 28% FiO2 with a PEEP of 5  Medications: Reviewed on Rounds  Physical Exam:  Vitals: Temperature 97.9 pulse 69 respiratory 21 blood pressure 96/45 saturations 100%  Ventilator Settings mode ventilation pressure assist control FiO2 28% PEEP 5 tidal volume 329  . General: Comfortable at this time . Eyes: Grossly normal lids, irises & conjunctiva . ENT: grossly tongue is normal . Neck: no obvious mass . Cardiovascular: S1 S2 normal no gallop . Respiratory: No rhonchi or rales are noted . Abdomen: soft . Skin: no rash seen on limited exam . Musculoskeletal: not rigid . Psychiatric:unable to assess . Neurologic: no seizure no involuntary movements         Lab Data:   Basic Metabolic Panel: No results for input(s): NA, K, CL, CO2, GLUCOSE, BUN, CREATININE, CALCIUM, MG, PHOS in the last 168 hours.  ABG: No results for input(s): PHART, PCO2ART, PO2ART, HCO3, O2SAT in the last 168 hours.  Liver Function Tests: No results for input(s): AST, ALT, ALKPHOS, BILITOT, PROT, ALBUMIN in the last 168 hours. No results for input(s): LIPASE, AMYLASE in the last 168 hours. No results for input(s): AMMONIA in the last 168 hours.  CBC: Recent Labs  Lab 07/05/18 0453  WBC 4.9  HGB 9.4*  HCT 31.4*  MCV 95.2  PLT 157    Cardiac Enzymes: No results for input(s): CKTOTAL, CKMB, CKMBINDEX, TROPONINI in the last 168 hours.  BNP (last 3 results) No results for input(s): BNP in the last 8760 hours.  ProBNP (last 3 results) No results for input(s): PROBNP in the last  8760 hours.  Radiological Exams: No results found.  Assessment/Plan Active Problems:   Acute on chronic respiratory failure with hypoxia (HCC)   Tracheostomy status (HCC)   Severe sepsis (HCC)   Aspiration pneumonia of both lower lobes due to gastric secretions (HCC)   Quadriplegia (HCC)   Hematoma of leg, right, sequela   1. Acute on chronic respiratory failure with hypoxia we will continue with full support on the ventilator home training is set to happen 2. Tracheostomy remains in place 3. Severe sepsis resolved 4. Aspiration pneumonia treated continue with supportive care. 5. Quadriplegia unchanged 6. Hematoma treated we will continue to monitor   I have personally seen and evaluated the patient, evaluated laboratory and imaging results, formulated the assessment and plan and placed orders. The Patient requires high complexity decision making for assessment and support.  Case was discussed on Rounds with the Respiratory Therapy Staff  Yevonne Pax, MD [facility] Pulmonary Critical Care Medicine Sleep Medicine

## 2018-07-08 DIAGNOSIS — J9621 Acute and chronic respiratory failure with hypoxia: Secondary | ICD-10-CM | POA: Diagnosis not present

## 2018-07-08 DIAGNOSIS — J690 Pneumonitis due to inhalation of food and vomit: Secondary | ICD-10-CM | POA: Diagnosis not present

## 2018-07-08 DIAGNOSIS — G8250 Quadriplegia, unspecified: Secondary | ICD-10-CM | POA: Diagnosis not present

## 2018-07-08 DIAGNOSIS — A419 Sepsis, unspecified organism: Secondary | ICD-10-CM | POA: Diagnosis not present

## 2018-07-08 NOTE — Progress Notes (Addendum)
Pulmonary Critical Care Medicine [facility] [place]   PULMONARY CRITICAL CARE SERVICE  PROGRESS NOTE  Date of Service: 07/09/2018  Melissa Yates  [id number]:[id number]  DOB: [birth year]   DOA: 06/01/2018  Referring Physician: Carron Curie, MD  HPI: Melissa Yates is a 63 y.o. female seen for follow up of Acute on Chronic Respiratory Failure.  Patient is at her baseline on a trilogy ventilator with 2 L of oxygen bleeding and at this time.  Overall patient appears to be doing well.  And is at baseline.  Medications: Reviewed on Rounds  Physical Exam:  Vitals: Pulse 64 respirations 18 BP 114/49 O2 sat 100% temp 97.8  Ventilator Settings FiO2 28% PEEP of 5 tidal volume 330  . General: Comfortable at this time . Eyes: Grossly normal lids, irises & conjunctiva . ENT: grossly tongue is normal . Neck: no obvious mass . Cardiovascular: S1 S2 normal no gallop . Respiratory: No rales or rhonchi noted . Abdomen: soft . Skin: no rash seen on limited exam . Musculoskeletal: not rigid . Psychiatric:unable to assess . Neurologic: no seizure no involuntary movements         Lab Data:   Basic Metabolic Panel: No results for input(s): NA, K, CL, CO2, GLUCOSE, BUN, CREATININE, CALCIUM, MG, PHOS in the last 168 hours.  ABG: No results for input(s): PHART, PCO2ART, PO2ART, HCO3, O2SAT in the last 168 hours.  Liver Function Tests: No results for input(s): AST, ALT, ALKPHOS, BILITOT, PROT, ALBUMIN in the last 168 hours. No results for input(s): LIPASE, AMYLASE in the last 168 hours. No results for input(s): AMMONIA in the last 168 hours.  CBC: Recent Labs  Lab 07/05/18 0453  WBC 4.9  HGB 9.4*  HCT 31.4*  MCV 95.2  PLT 157    Cardiac Enzymes: No results for input(s): CKTOTAL, CKMB, CKMBINDEX, TROPONINI in the last 168 hours.  BNP (last 3 results) No results for input(s): BNP in the last 8760 hours.  ProBNP (last 3 results) No results for input(s): PROBNP in the last  8760 hours.  Radiological Exams: No results found.  Assessment/Plan Active Problems:   Acute on chronic respiratory failure with hypoxia (HCC)   Tracheostomy status (HCC)   Severe sepsis (HCC)   Aspiration pneumonia of both lower lobes due to gastric secretions (HCC)   Quadriplegia (HCC)   Hematoma of leg, right, sequela   1. Acute respiratory failure with hypoxia continue with full support on ventilator.  Patient is on trilogy home vent.  Family is the process of learning how to operate vent. 2. Tracheostomy remains in place 3. Severe sepsis resolved 4. Aspiration pneumonia treated continue supportive care 5. Quadriplegia unchanged 6. Hematoma treated continue to monitor   I have personally seen and evaluated the patient, evaluated laboratory and imaging results, formulated the assessment and plan and placed orders. The Patient requires high complexity decision making for assessment and support.  Case was discussed on Rounds with the Respiratory Therapy Staff  Yevonne Pax, MD [facility] Pulmonary Critical Care Medicine Sleep Medicine

## 2018-07-09 DIAGNOSIS — J690 Pneumonitis due to inhalation of food and vomit: Secondary | ICD-10-CM | POA: Diagnosis not present

## 2018-07-09 DIAGNOSIS — G8250 Quadriplegia, unspecified: Secondary | ICD-10-CM | POA: Diagnosis not present

## 2018-07-09 DIAGNOSIS — J9621 Acute and chronic respiratory failure with hypoxia: Secondary | ICD-10-CM | POA: Diagnosis not present

## 2018-07-09 DIAGNOSIS — A419 Sepsis, unspecified organism: Secondary | ICD-10-CM | POA: Diagnosis not present

## 2018-07-09 NOTE — Progress Notes (Addendum)
Pulmonary Critical Care Medicine [facility] [place]   PULMONARY CRITICAL CARE SERVICE  PROGRESS NOTE  Date of Service: 07/09/2018  Melissa Yates  [id number]:[id number]  DOB: Apr 27, 1956   DOA: 06/01/2018  Referring Physician: Carron Curie, MD  HPI: Melissa Yates is a 63 y.o. female seen for follow up of Acute on Chronic Respiratory Failure.  Patient is doing well on trilogy with 2 L of oxygen bleeding and.  Currently breathing between 18 and 24 times a minute and pulling tidal volumes of 3 50-4 50.  Minute volume is currently 7.5.  Medications: Reviewed on Rounds  Physical Exam:  Vitals: Pulse 79 respirations 20 BP 127/68 O2 sat 100% temp 98.5  Ventilator Settings on trilogy in room.  . General: Comfortable at this time . Eyes: Grossly normal lids, irises & conjunctiva . ENT: grossly tongue is normal . Neck: no obvious mass . Cardiovascular: S1 S2 normal no gallop . Respiratory: No rales or rhonchi noted . Abdomen: soft . Skin: no rash seen on limited exam . Musculoskeletal: not rigid . Psychiatric:unable to assess . Neurologic: no seizure no involuntary movements         Lab Data:   Basic Metabolic Panel: No results for input(s): NA, K, CL, CO2, GLUCOSE, BUN, CREATININE, CALCIUM, MG, PHOS in the last 168 hours.  ABG: No results for input(s): PHART, PCO2ART, PO2ART, HCO3, O2SAT in the last 168 hours.  Liver Function Tests: No results for input(s): AST, ALT, ALKPHOS, BILITOT, PROT, ALBUMIN in the last 168 hours. No results for input(s): LIPASE, AMYLASE in the last 168 hours. No results for input(s): AMMONIA in the last 168 hours.  CBC: Recent Labs  Lab 07/05/18 0453  WBC 4.9  HGB 9.4*  HCT 31.4*  MCV 95.2  PLT 157    Cardiac Enzymes: No results for input(s): CKTOTAL, CKMB, CKMBINDEX, TROPONINI in the last 168 hours.  BNP (last 3 results) No results for input(s): BNP in the last 8760 hours.  ProBNP (last 3 results) No results for input(s):  PROBNP in the last 8760 hours.  Radiological Exams: No results found.  Assessment/Plan Active Problems:   Acute on chronic respiratory failure with hypoxia (HCC)   Tracheostomy status (HCC)   Severe sepsis (HCC)   Aspiration pneumonia of both lower lobes due to gastric secretions (HCC)   Quadriplegia (HCC)   Hematoma of leg, right, sequela   1. Acute respiratory failure with hypoxia continue full support on ventilator.  Patient will use trilogy home vent.  Family is in the process of learning how to operate. 2. Tracheostomy remains in place 3. Severe sepsis resolved 4. Aspiration pneumonia treated continue supportive care 5. Quadriplegia unchanged 6. Hematoma treated continue to monitor   I have personally seen and evaluated the patient, evaluated laboratory and imaging results, formulated the assessment and plan and placed orders. The Patient requires high complexity decision making for assessment and support.  Case was discussed on Rounds with the Respiratory Therapy Staff  Yevonne Pax, MD [facility] Pulmonary Critical Care Medicine Sleep Medicine

## 2018-07-10 DIAGNOSIS — J9621 Acute and chronic respiratory failure with hypoxia: Secondary | ICD-10-CM | POA: Diagnosis not present

## 2018-07-10 DIAGNOSIS — J690 Pneumonitis due to inhalation of food and vomit: Secondary | ICD-10-CM | POA: Diagnosis not present

## 2018-07-10 DIAGNOSIS — G8250 Quadriplegia, unspecified: Secondary | ICD-10-CM | POA: Diagnosis not present

## 2018-07-10 DIAGNOSIS — A419 Sepsis, unspecified organism: Secondary | ICD-10-CM | POA: Diagnosis not present

## 2018-07-10 LAB — CBC
HCT: 33.3 % — ABNORMAL LOW (ref 36.0–46.0)
Hemoglobin: 9.8 g/dL — ABNORMAL LOW (ref 12.0–15.0)
MCH: 27.7 pg (ref 26.0–34.0)
MCHC: 29.4 g/dL — ABNORMAL LOW (ref 30.0–36.0)
MCV: 94.1 fL (ref 80.0–100.0)
NRBC: 0 % (ref 0.0–0.2)
Platelets: 197 10*3/uL (ref 150–400)
RBC: 3.54 MIL/uL — ABNORMAL LOW (ref 3.87–5.11)
RDW: 19.7 % — ABNORMAL HIGH (ref 11.5–15.5)
WBC: 6.5 10*3/uL (ref 4.0–10.5)

## 2018-07-10 LAB — BASIC METABOLIC PANEL
Anion gap: 6 (ref 5–15)
BUN: 22 mg/dL (ref 8–23)
CO2: 28 mmol/L (ref 22–32)
CREATININE: 0.43 mg/dL — AB (ref 0.44–1.00)
Calcium: 8 mg/dL — ABNORMAL LOW (ref 8.9–10.3)
Chloride: 108 mmol/L (ref 98–111)
GFR calc Af Amer: >60 mL/min (ref >60)
GFR calc non Af Amer: >60 mL/min (ref >60)
Glucose, Bld: 114 mg/dL — ABNORMAL HIGH (ref 70–99)
Potassium: 3.4 mmol/L — ABNORMAL LOW (ref 3.5–5.1)
Sodium: 142 mmol/L (ref 135–145)

## 2018-07-10 NOTE — Progress Notes (Signed)
Pulmonary Critical Care Medicine [facility] [place]   PULMONARY CRITICAL CARE SERVICE  PROGRESS NOTE  Date of Service: 07/10/2018  Melissa Yates  [id number]  DOB: 1956-04-09   DOA: 06/01/2018  Referring Physician: Carron Curie, MD  HPI: Melissa Yates is a 63 y.o. female seen for follow up of Acute on Chronic Respiratory Failure.  Patient currently is on her trilogy home ventilator.  The family has been trained they are feeling comfortable with using the machine  Medications: Reviewed on Rounds  Physical Exam:  Vitals: Temperature 97.8 pulse 78 respiratory rate 23 blood pressure 116/56 saturations 95%  Ventilator Settings patient is currently on trilogy  . General: Comfortable at this time . Eyes: Grossly normal lids, irises & conjunctiva . ENT: grossly tongue is normal . Neck: no obvious mass . Cardiovascular: S1 S2 normal no gallop . Respiratory: No rhonchi coarse breath sounds . Abdomen: soft . Skin: no rash seen on limited exam . Musculoskeletal: not rigid . Psychiatric:unable to assess . Neurologic: no seizure no involuntary movements         Lab Data:   Basic Metabolic Panel: Recent Labs  Lab 07/10/18 1052  NA 142  K 3.4*  CL 108  CO2 28  GLUCOSE 114*  BUN 22  CREATININE 0.43*  CALCIUM 8.0*    ABG: No results for input(s): PHART, PCO2ART, PO2ART, HCO3, O2SAT in the last 168 hours.  Liver Function Tests: No results for input(s): AST, ALT, ALKPHOS, BILITOT, PROT, ALBUMIN in the last 168 hours. No results for input(s): LIPASE, AMYLASE in the last 168 hours. No results for input(s): AMMONIA in the last 168 hours.  CBC: Recent Labs  Lab 07/05/18 0453 07/10/18 1052  WBC 4.9 6.5  HGB 9.4* 9.8*  HCT 31.4* 33.3*  MCV 95.2 94.1  PLT 157 197    Cardiac Enzymes: No results for input(s): CKTOTAL, CKMB, CKMBINDEX, TROPONINI in the last 168 hours.  BNP (last 3 results) No results for input(s): BNP in the last 8760 hours.  ProBNP  (last 3 results) No results for input(s): PROBNP in the last 8760 hours.  Radiological Exams: No results found.  Assessment/Plan Active Problems:   Acute on chronic respiratory failure with hypoxia (HCC)   Tracheostomy status (HCC)   Severe sepsis (HCC)   Aspiration pneumonia of both lower lobes due to gastric secretions (HCC)   Quadriplegia (HCC)   Hematoma of leg, right, sequela   1. Acute on chronic respiratory failure with hypoxia we will continue with home ventilator usage and training as per RT staff 2. Tracheostomy permanent remains in place 3. Severe sepsis now resolved 4. Aspiration pneumonia treated we will continue with supportive care 5. Quadriplegia unchanged   I have personally seen and evaluated the patient, evaluated laboratory and imaging results, formulated the assessment and plan and placed orders. The Patient requires high complexity decision making for assessment and support.  Case was discussed on Rounds with the Respiratory Therapy Staff  Yevonne Pax, MD [facility] Pulmonary Critical Care Medicine Sleep Medicine

## 2018-07-11 DIAGNOSIS — G8250 Quadriplegia, unspecified: Secondary | ICD-10-CM | POA: Diagnosis not present

## 2018-07-11 DIAGNOSIS — J9621 Acute and chronic respiratory failure with hypoxia: Secondary | ICD-10-CM | POA: Diagnosis not present

## 2018-07-11 DIAGNOSIS — A419 Sepsis, unspecified organism: Secondary | ICD-10-CM | POA: Diagnosis not present

## 2018-07-11 DIAGNOSIS — J690 Pneumonitis due to inhalation of food and vomit: Secondary | ICD-10-CM | POA: Diagnosis not present

## 2018-07-11 NOTE — Progress Notes (Addendum)
Pulmonary Critical Care Medicine [facility] [place]   PULMONARY CRITICAL CARE SERVICE  PROGRESS NOTE  Date of Service: 07/11/2018  [name]  ZOX:[id number]  DOB: 05-28-1955   DOA: 06/01/2018  Referring Physician: Carron Curie, MD  HPI: Melissa Yates is a 63 y.o. female seen for follow up of Acute on Chronic Respiratory Failure.  Patient continues to use trilogy home ventilator.  Family appears comfortable using ventilator at this time.  Continue present management.  Medications: Reviewed on Rounds  Physical Exam:  Vitals: Pulse 50 respirations 21 BP 123/61 O2 sat 99% temp 97.6  Ventilator Settings trilogy home ventilator AC PC rate of 18 IP 22 PEEP of 5 with 2 L oxygen.  . General: Comfortable at this time . Eyes: Grossly normal lids, irises & conjunctiva . ENT: grossly tongue is normal . Neck: no obvious mass . Cardiovascular: S1 S2 normal no gallop . Respiratory: No rales or rhonchi noted . Abdomen: soft . Skin: no rash seen on limited exam . Musculoskeletal: not rigid . Psychiatric:unable to assess . Neurologic: no seizure no involuntary movements         Lab Data:   Basic Metabolic Panel: Recent Labs  Lab 07/10/18 1052  NA 142  K 3.4*  CL 108  CO2 28  GLUCOSE 114*  BUN 22  CREATININE 0.43*  CALCIUM 8.0*    ABG: No results for input(s): PHART, PCO2ART, PO2ART, HCO3, O2SAT in the last 168 hours.  Liver Function Tests: No results for input(s): AST, ALT, ALKPHOS, BILITOT, PROT, ALBUMIN in the last 168 hours. No results for input(s): LIPASE, AMYLASE in the last 168 hours. No results for input(s): AMMONIA in the last 168 hours.  CBC: Recent Labs  Lab 07/05/18 0453 07/10/18 1052  WBC 4.9 6.5  HGB 9.4* 9.8*  HCT 31.4* 33.3*  MCV 95.2 94.1  PLT 157 197    Cardiac Enzymes: No results for input(s): CKTOTAL, CKMB, CKMBINDEX, TROPONINI in the last 168 hours.  BNP (last 3 results) No results for input(s): BNP in the last 8760  hours.  ProBNP (last 3 results) No results for input(s): PROBNP in the last 8760 hours.  Radiological Exams: No results found.  Assessment/Plan Active Problems:   Acute on chronic respiratory failure with hypoxia (HCC)   Tracheostomy status (HCC)   Severe sepsis (HCC)   Aspiration pneumonia of both lower lobes due to gastric secretions (HCC)   Quadriplegia (HCC)   Hematoma of leg, right, sequela   1. Acute on chronic respiratory failure with hypoxia continue home ventilator usage at night continue to reiterate training to family members. 2. Tracheostomy permanent remains in place 3. Severe sepsis now resolved 4. Aspiration pneumonia treated continue supportive care 5. Quadriplegia unchanged   I have personally seen and evaluated the patient, evaluated laboratory and imaging results, formulated the assessment and plan and placed orders. The Patient requires high complexity decision making for assessment and support.  Case was discussed on Rounds with the Respiratory Therapy Staff  Yevonne Pax, MD [facility] Pulmonary Critical Care Medicine Sleep Medicine

## 2018-07-12 DIAGNOSIS — A419 Sepsis, unspecified organism: Secondary | ICD-10-CM | POA: Diagnosis not present

## 2018-07-12 DIAGNOSIS — J9621 Acute and chronic respiratory failure with hypoxia: Secondary | ICD-10-CM | POA: Diagnosis not present

## 2018-07-12 DIAGNOSIS — Z930 Tracheostomy status: Secondary | ICD-10-CM | POA: Diagnosis not present

## 2018-07-12 DIAGNOSIS — J690 Pneumonitis due to inhalation of food and vomit: Secondary | ICD-10-CM | POA: Diagnosis not present

## 2018-07-12 LAB — POTASSIUM: Potassium: 3.8 mmol/L (ref 3.5–5.1)

## 2018-07-12 NOTE — Progress Notes (Addendum)
Pulmonary Critical Care Medicine [facility] [place]   PULMONARY CRITICAL CARE SERVICE  PROGRESS NOTE  Date of Service: 07/12/2018  Melissa Yates  ZPH:[id number]  DOB: May 01, 1956   DOA: 06/01/2018  Referring Physician: Carron Curie, MD  HPI: Melissa Yates is a 63 y.o. female seen for follow up of Acute on Chronic Respiratory Failure.  Patient is doing well on trilogy home vent.  Patient is scheduled for discharge at this time.  Patient will likely leave today.  Medications: Reviewed on Rounds  Physical Exam:  Vitals: Pulse 72 respirations 18 BP 109/56 O2 sat 9% temp 98.6  Ventilator Settings currently resting on trilogy home ventilator.  . General: Comfortable at this time . Eyes: Grossly normal lids, irises & conjunctiva . ENT: grossly tongue is normal . Neck: no obvious mass . Cardiovascular: S1 S2 normal no gallop . Respiratory: No rales or rhonchi noted . Abdomen: soft . Skin: no rash seen on limited exam . Musculoskeletal: not rigid . Psychiatric:unable to assess . Neurologic: no seizure no involuntary movements         Lab Data:   Basic Metabolic Panel: Recent Labs  Lab 07/10/18 1052 07/12/18 0550  NA 142  --   K 3.4* 3.8  CL 108  --   CO2 28  --   GLUCOSE 114*  --   BUN 22  --   CREATININE 0.43*  --   CALCIUM 8.0*  --     ABG: No results for input(s): PHART, PCO2ART, PO2ART, HCO3, O2SAT in the last 168 hours.  Liver Function Tests: No results for input(s): AST, ALT, ALKPHOS, BILITOT, PROT, ALBUMIN in the last 168 hours. No results for input(s): LIPASE, AMYLASE in the last 168 hours. No results for input(s): AMMONIA in the last 168 hours.  CBC: Recent Labs  Lab 07/10/18 1052  WBC 6.5  HGB 9.8*  HCT 33.3*  MCV 94.1  PLT 197    Cardiac Enzymes: No results for input(s): CKTOTAL, CKMB, CKMBINDEX, TROPONINI in the last 168 hours.  BNP (last 3 results) No results for input(s): BNP in the last 8760 hours.  ProBNP (last 3  results) No results for input(s): PROBNP in the last 8760 hours.  Radiological Exams: No results found.  Assessment/Plan Active Problems:   Acute on chronic respiratory failure with hypoxia (HCC)   Tracheostomy status (HCC)   Severe sepsis (HCC)   Aspiration pneumonia of both lower lobes due to gastric secretions (HCC)   Quadriplegia (HCC)   Hematoma of leg, right, sequela   1. Acute on chronic respiratory failure with hypoxia patient will be discharged with home ventilator at this time.  Patient feels comfortable as well as family at operating ventilator. 2. Tracheostomy permanent remains in place 3. Severe sepsis resolved 4. Aspiration pneumonia treated and resolved 5. Quadriplegia unchanged   I have personally seen and evaluated the patient, evaluated laboratory and imaging results, formulated the assessment and plan and placed orders. The Patient requires high complexity decision making for assessment and support.  Case was discussed on Rounds with the Respiratory Therapy Staff  Yevonne Pax, MD [facility] Pulmonary Critical Care Medicine Sleep Medicine

## 2018-12-02 DEATH — deceased

## 2019-11-17 IMAGING — DX DG CHEST 1V PORT
1 series · 1 of 1 positions shown · non-contrast
Comparison: 03/07/2018

CLINICAL DATA: Pneumonia

EXAM:
PORTABLE CHEST 1 VIEW

[chest]
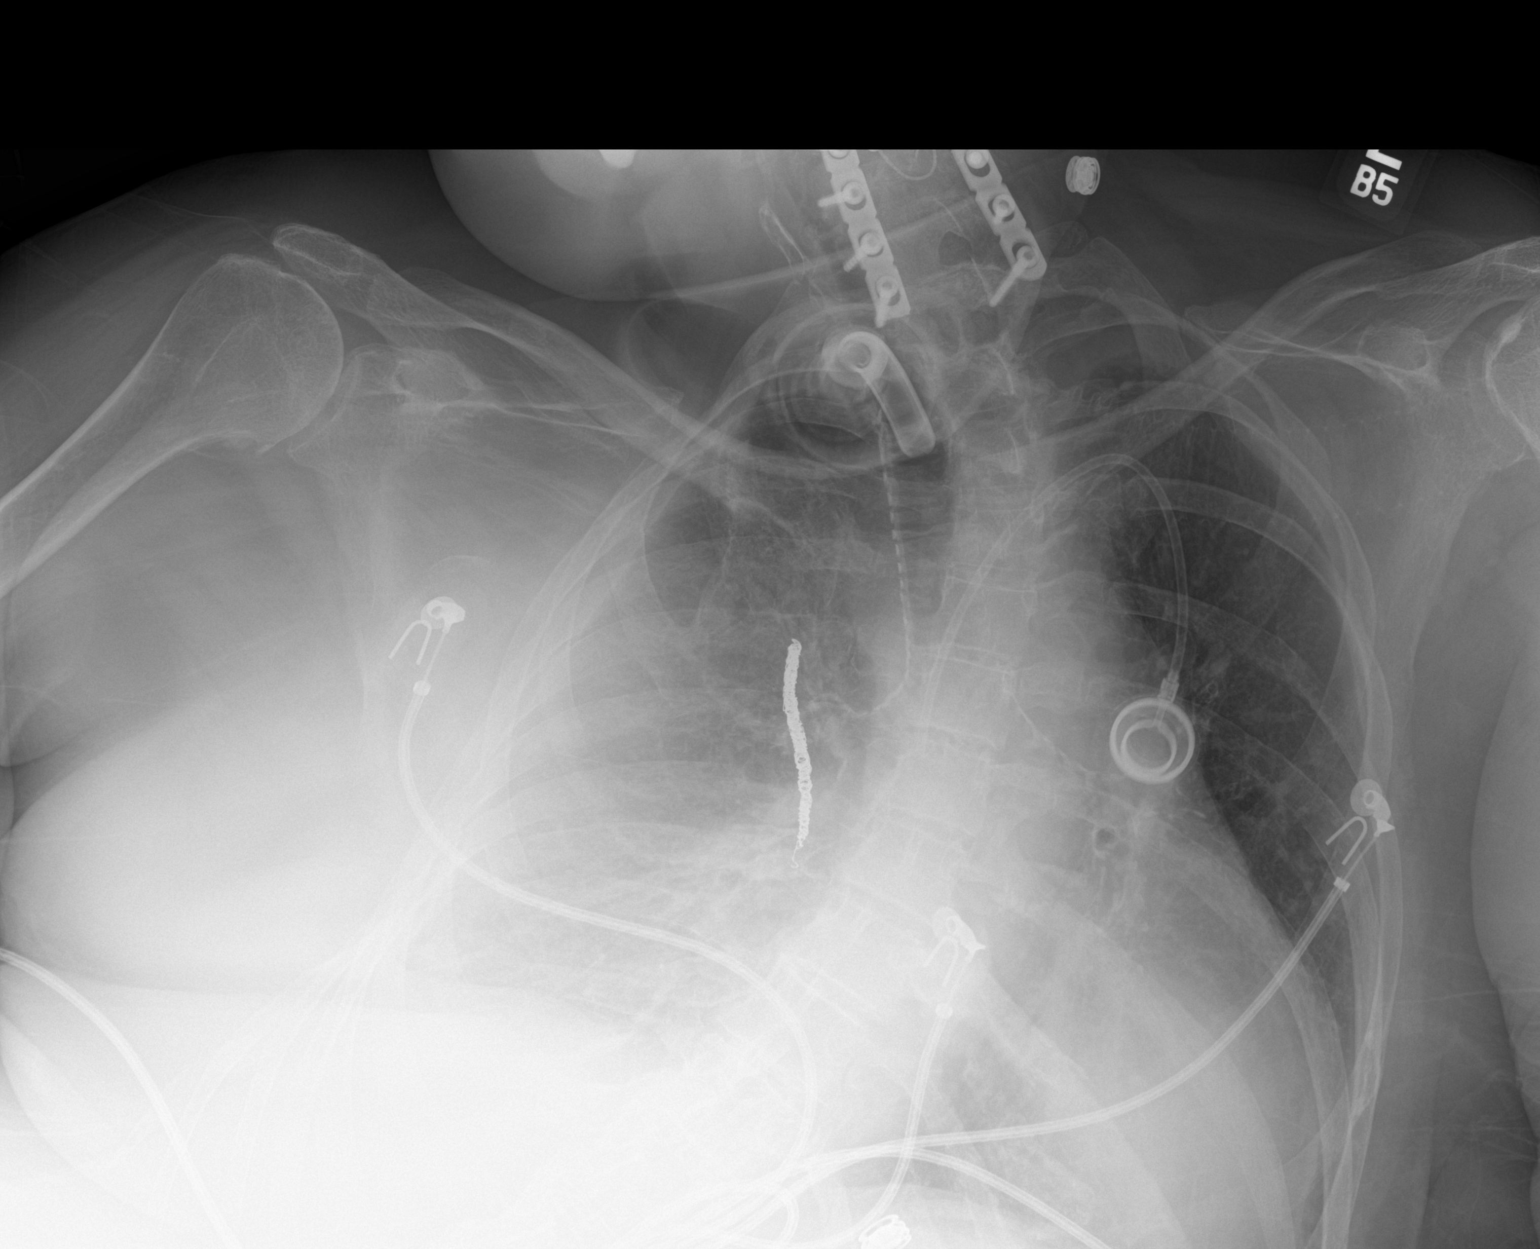

[1 of 1 positions shown; findings below may reference images not displayed]

FINDINGS: Left Port-A-Cath and tracheostomy remain in place, unchanged.
Cardiomegaly. Suspect layering effusions. Perihilar and lower lobe
atelectasis or infiltrates. Findings are similar prior study.
IMPRESSION: Cardiomegaly with bilateral perihilar and lower lobe atelectasis or
infiltrates. Findings could reflect edema. Suspect layering
effusions. No real change.

## 2020-01-31 IMAGING — MR MR HEAD W/O CM
10 series · 48 of 48 positions shown · non-contrast
Comparison: Head CT 06/03/2018.

CLINICAL DATA: 62-year-old female status post a cardiac arrest with
persistently altered mental status, abnormal noncontrast head CT 3
days ago.

EXAM:
MRI HEAD WITHOUT CONTRAST
TECHNIQUE: Multiplanar, multiecho pulse sequences of the brain and surrounding
structures were obtained without intravenous contrast.

[Series 5: DWI · axial · 3.0mm · 0.88mm/px · z∈[-141,-7]mm · 10 of 100 slices shown (1 of 4)]
[im 1/100]
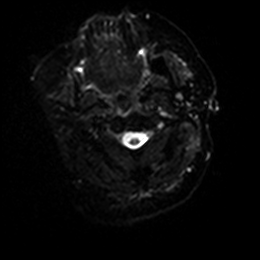
[im 12/100]
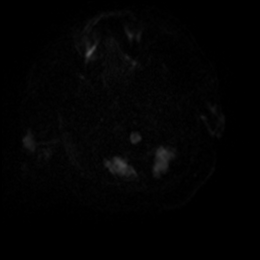
[im 23/100]
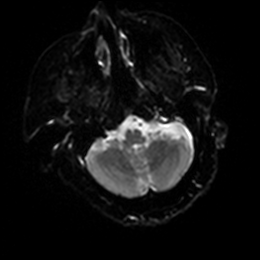
[im 34/100]
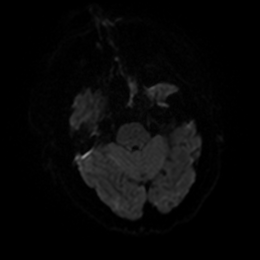
[im 45/100]
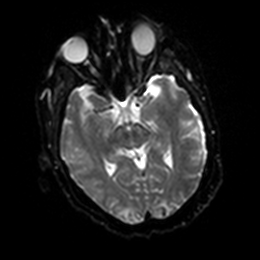
[im 56/100]
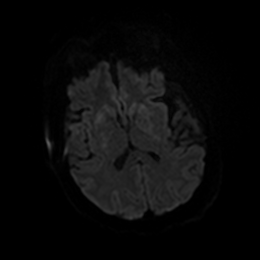
[im 67/100]
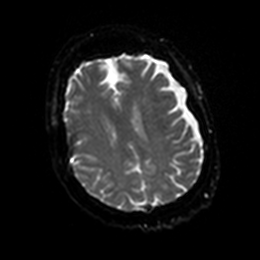
[im 78/100]
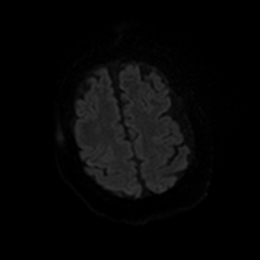
[im 89/100]
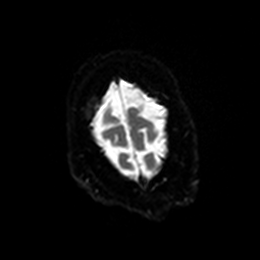
[im 100/100]
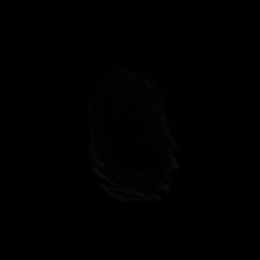

[Series 6: DWI · axial · 3.0mm · 0.88mm/px · z∈[-141,-7]mm · 5 of 48 slices shown (2 of 4)]
[im 1/48]
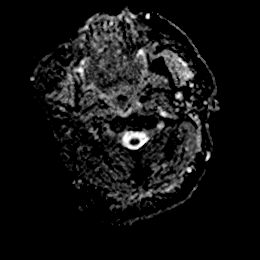
[im 12/48]
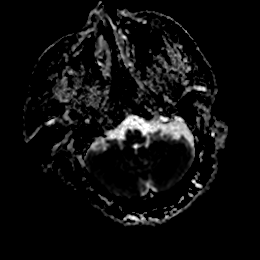
[im 24/48]
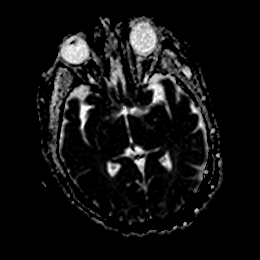
[im 36/48]
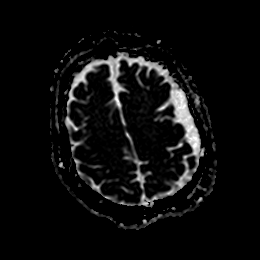
[im 48/48]
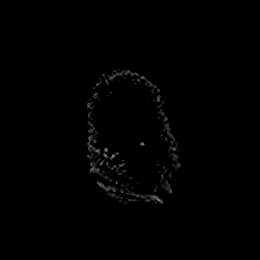

[Series 7: DWI · coronal · 4.0mm · 0.88mm/px · 7 of 64 slices shown (3 of 4)]
[im 1/64]
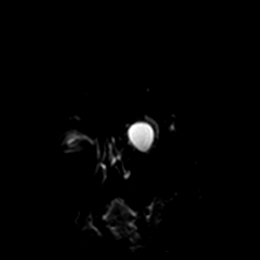
[im 11/64]
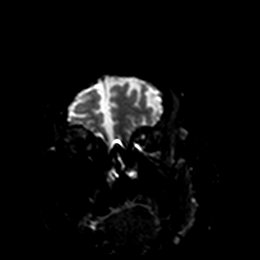
[im 22/64]
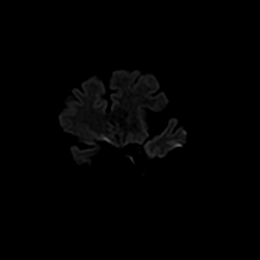
[im 32/64]
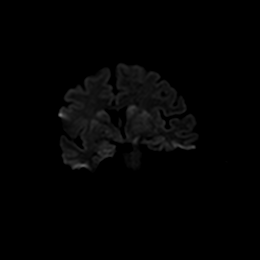
[im 43/64]
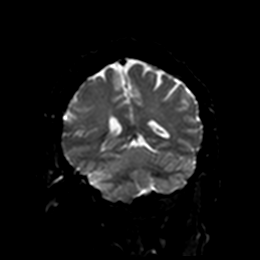
[im 53/64]
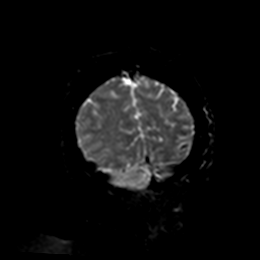
[im 64/64]
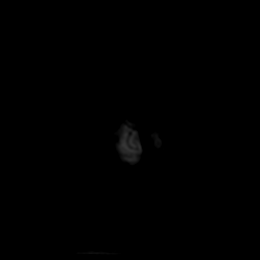

[Series 8: DWI · coronal · 4.0mm · 0.88mm/px · 3 of 32 slices shown (4 of 4)]
[im 1/32]
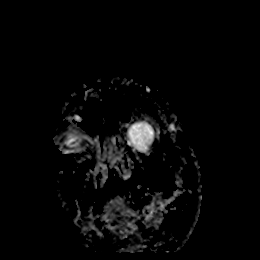
[im 16/32]
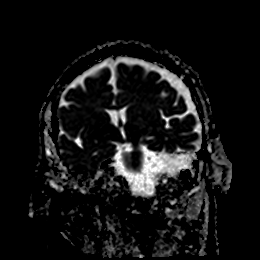
[im 32/32]
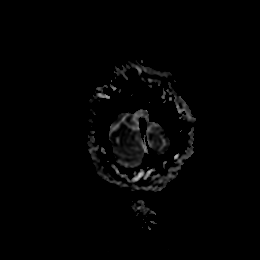

[Series 9: T1 · sagittal · 5.0mm · 0.75mm/px · 2 of 23 slices shown]
[im 1/23]
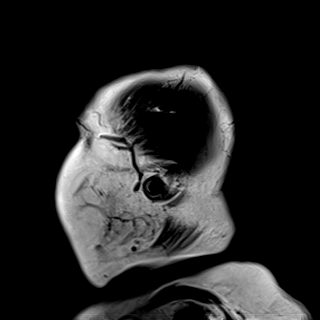
[im 23/23]
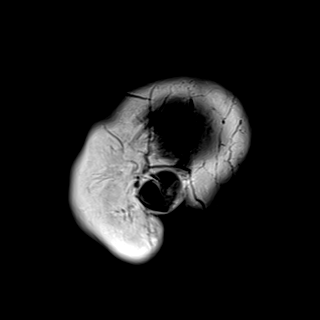

[Series 10: T2 · axial · 5.0mm · 0.72mm/px · z∈[-151,-11]mm · 3 of 27 slices shown]
[im 1/27]
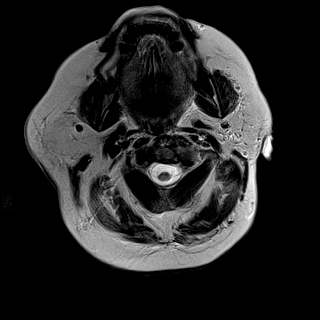
[im 14/27]
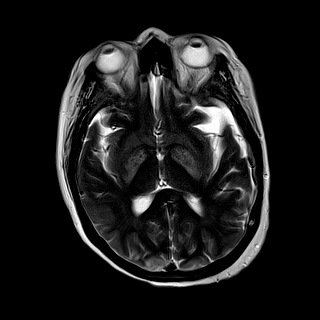
[im 27/27]
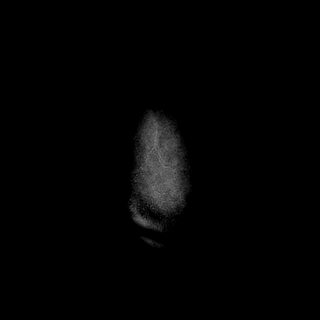

[Series 15: FLAIR · axial · 5.0mm · 0.45mm/px · z∈[-115,+27]mm · 3 of 27 slices shown]
[im 1/27]
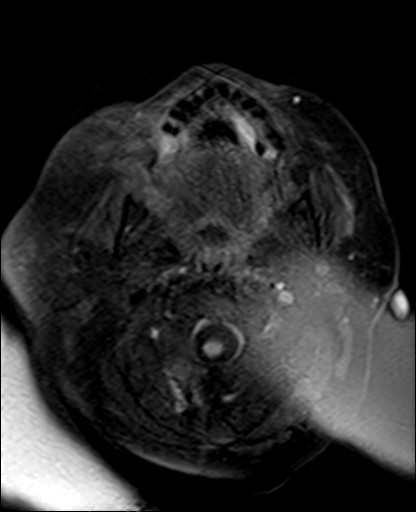
[im 14/27]
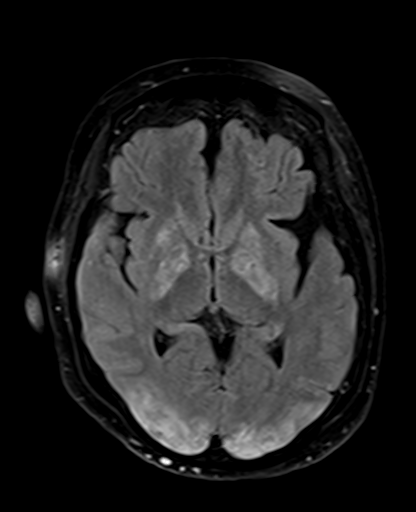
[im 27/27]
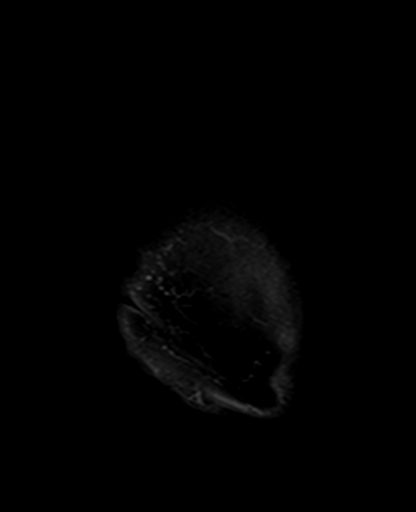

[Series 17: pha_images · axial · 3.0mm · 0.90mm/px · z∈[-122,+37]mm · 6 of 59 slices shown]
[im 1/59]
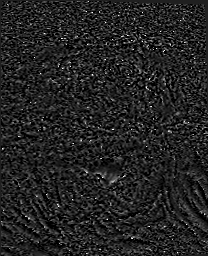
[im 12/59]
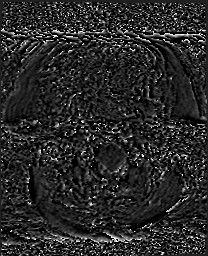
[im 24/59]
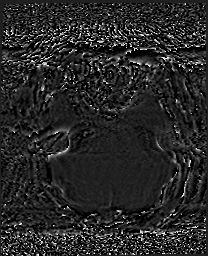
[im 35/59]
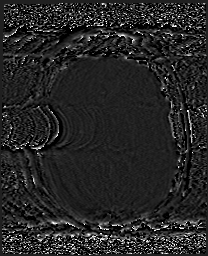
[im 47/59]
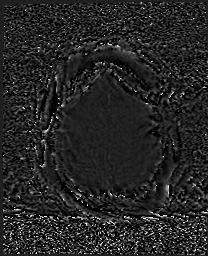
[im 59/59]
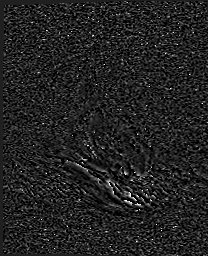

[Series 18: swi_images · axial · 3.0mm · 0.90mm/px · z∈[-125,+37]mm · 6 of 60 slices shown]
[im 1/60]
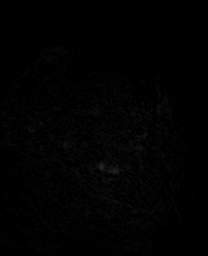
[im 12/60]
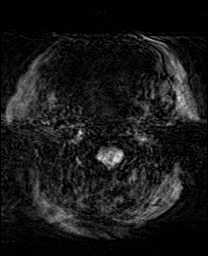
[im 24/60]
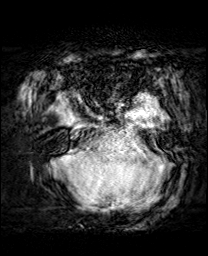
[im 36/60]
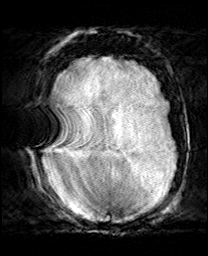
[im 48/60]
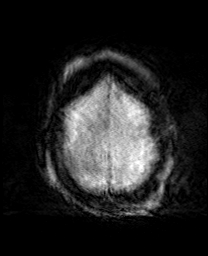
[im 60/60]
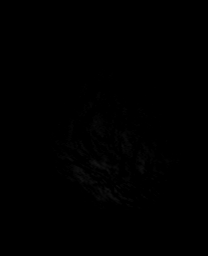

[Series 20: T2 post-contrast · coronal · 5.0mm · 0.72mm/px · 3 of 28 slices shown]
[im 1/28]
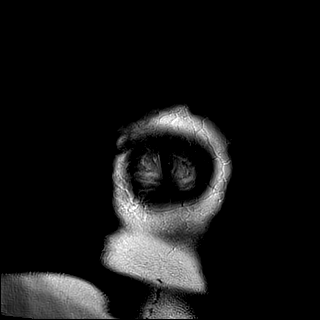
[im 14/28]
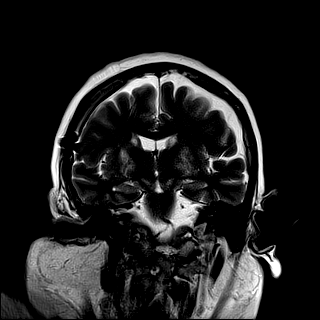
[im 28/28]
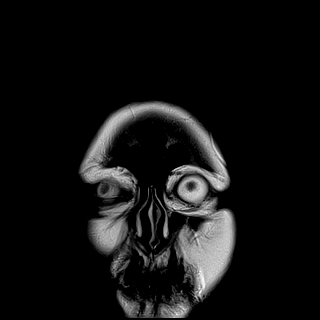

[48 of 48 positions shown; findings below may reference images not displayed]

FINDINGS: Brain: Confluent abnormal T2 and FLAIR hyperintensity with some
restricted diffusion in the bilateral basal ganglia. Similar
abnormal T2 and FLAIR hyperintensity with more subtle diffusion
abnormality in both occipital poles (series 15, image 15). These
correspond to the areas of abnormal hypodensity by CT. There does
appear to be a small additional cortical area of involvement at the
right superior motor strip on series 15, image 23. No other cortical
or white matter involvement. But there is also symmetric abnormal
diffusion, T2 and FLAIR in the midbrain (series 5, image 72). The
thalami are spared. The brainstem is spared. However, there is
questionable generalized abnormal cerebellar T2 signal and diffusion
(series 10, image 7).

There is intrinsic T1 hyperintensity in the basal ganglia and
occipital lobes which is probably laminar necrosis, as SWI is motion
degraded but without convincing evidence of blood products. No
associated mass effect.

No midline shift or ventriculomegaly. Patent basilar cisterns. No
extra-axial collection. Negative pituitary and cervicomedullary
junction.

Vascular: Major intracranial vascular flow voids are preserved.

Skull and upper cervical spine: Partially visible postoperative
changes to the cervical spine. Visualized bone marrow signal is
within normal limits.

Sinuses/Orbits: Negative orbits. Mild paranasal sinus mucosal
thickening.

Other: Trace mastoid fluid. Scalp and face soft tissues appear
negative.
IMPRESSION: 1. Symmetric abnormal signal in the bilateral basal ganglia,
midbrain, occipital poles, and probably also the cerebellum.
Evidence of laminar necrosis in the basal ganglia and occiput.
Minimal if any additional cortical signal abnormality at the right
superior motor strip.
The constellation favors Anoxic Injury over posterior reversible
encephalopathy syndrome (PRES).
2. No associated mass effect or strong evidence of intracranial
hemorrhage.
